# Patient Record
Sex: Male | Born: 1945 | Race: White | Hispanic: No | Marital: Married | Smoking: Never smoker
Health system: Southern US, Community
[De-identification: ages and names within clinical notes are randomized; demographics above are authoritative.]

## PROBLEM LIST (undated history)

## (undated) DIAGNOSIS — E785 Hyperlipidemia, unspecified: Secondary | ICD-10-CM

## (undated) DIAGNOSIS — R05 Cough: Secondary | ICD-10-CM

## (undated) DIAGNOSIS — M419 Scoliosis, unspecified: Secondary | ICD-10-CM

## (undated) DIAGNOSIS — I10 Essential (primary) hypertension: Secondary | ICD-10-CM

## (undated) DIAGNOSIS — Z860100 Personal history of colon polyps, unspecified: Secondary | ICD-10-CM

## (undated) DIAGNOSIS — R059 Cough, unspecified: Secondary | ICD-10-CM

## (undated) DIAGNOSIS — H269 Unspecified cataract: Secondary | ICD-10-CM

## (undated) HISTORY — DX: Personal history of colon polyps, unspecified: Z86.0100

## (undated) HISTORY — DX: Scoliosis, unspecified: M41.9

## (undated) HISTORY — PX: OTHER SURGICAL HISTORY: SHX169

## (undated) HISTORY — PX: HERNIA REPAIR: SHX51

## (undated) HISTORY — PX: TONSILLECTOMY: SUR1361

## (undated) HISTORY — PX: HIP SURGERY: SHX245

## (undated) HISTORY — DX: Cough, unspecified: R05.9

## (undated) HISTORY — DX: Essential (primary) hypertension: I10

## (undated) HISTORY — DX: Hyperlipidemia, unspecified: E78.5

## (undated) HISTORY — DX: Personal history of colonic polyps: Z86.010

## (undated) HISTORY — DX: Unspecified cataract: H26.9

---
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
## 2005-04-17 ENCOUNTER — Ambulatory Visit: Payer: Self-pay | Admitting: Internal Medicine

## 2005-05-15 ENCOUNTER — Ambulatory Visit: Payer: Self-pay | Admitting: Internal Medicine

## 2005-05-15 ENCOUNTER — Ambulatory Visit: Payer: Self-pay

## 2006-02-23 ENCOUNTER — Ambulatory Visit: Payer: Self-pay | Admitting: Internal Medicine

## 2006-03-19 ENCOUNTER — Ambulatory Visit: Payer: Self-pay | Admitting: Internal Medicine

## 2006-05-06 ENCOUNTER — Ambulatory Visit: Payer: Self-pay | Admitting: Family Medicine

## 2006-05-27 ENCOUNTER — Ambulatory Visit: Payer: Self-pay | Admitting: Family Medicine

## 2006-11-09 ENCOUNTER — Ambulatory Visit: Payer: Self-pay | Admitting: Family Medicine

## 2007-03-22 ENCOUNTER — Ambulatory Visit (HOSPITAL_COMMUNITY): Admission: RE | Admit: 2007-03-22 | Discharge: 2007-03-22 | Payer: Self-pay | Admitting: General Surgery

## 2007-06-08 ENCOUNTER — Ambulatory Visit: Payer: Self-pay | Admitting: Family Medicine

## 2007-06-22 ENCOUNTER — Ambulatory Visit: Payer: Self-pay | Admitting: Family Medicine

## 2007-07-21 ENCOUNTER — Ambulatory Visit: Payer: Self-pay | Admitting: Family Medicine

## 2007-08-20 ENCOUNTER — Ambulatory Visit: Payer: Self-pay | Admitting: Family Medicine

## 2007-08-27 ENCOUNTER — Ambulatory Visit: Payer: Self-pay | Admitting: Internal Medicine

## 2007-08-27 LAB — CONVERTED CEMR LAB
BUN: 14 mg/dL (ref 6–23)
Calcium: 9.4 mg/dL (ref 8.4–10.5)
GFR calc Af Amer: 177 mL/min
GFR calc non Af Amer: 146 mL/min

## 2008-01-25 ENCOUNTER — Ambulatory Visit: Payer: Self-pay | Admitting: Internal Medicine

## 2008-01-31 ENCOUNTER — Ambulatory Visit: Payer: Self-pay | Admitting: Internal Medicine

## 2008-03-23 ENCOUNTER — Ambulatory Visit: Payer: Self-pay | Admitting: Internal Medicine

## 2008-03-23 LAB — CONVERTED CEMR LAB
ALT: 31 units/L (ref 0–53)
Total CK: 134 units/L (ref 7–195)

## 2008-04-03 ENCOUNTER — Ambulatory Visit: Payer: Self-pay | Admitting: Family Medicine

## 2008-07-14 HISTORY — PX: COLONOSCOPY: SHX174

## 2008-09-07 ENCOUNTER — Ambulatory Visit: Payer: Self-pay | Admitting: Family Medicine

## 2008-10-18 ENCOUNTER — Ambulatory Visit: Payer: Self-pay | Admitting: Gastroenterology

## 2008-11-02 ENCOUNTER — Telehealth (INDEPENDENT_AMBULATORY_CARE_PROVIDER_SITE_OTHER): Payer: Self-pay | Admitting: *Deleted

## 2008-12-12 ENCOUNTER — Ambulatory Visit: Payer: Self-pay | Admitting: Internal Medicine

## 2008-12-13 ENCOUNTER — Encounter: Payer: Self-pay | Admitting: Internal Medicine

## 2009-02-10 DIAGNOSIS — I10 Essential (primary) hypertension: Secondary | ICD-10-CM | POA: Insufficient documentation

## 2009-02-10 DIAGNOSIS — E785 Hyperlipidemia, unspecified: Secondary | ICD-10-CM

## 2009-02-20 ENCOUNTER — Encounter: Payer: Self-pay | Admitting: Internal Medicine

## 2009-02-22 ENCOUNTER — Ambulatory Visit: Payer: Self-pay | Admitting: Internal Medicine

## 2009-03-05 ENCOUNTER — Ambulatory Visit: Payer: Self-pay

## 2009-03-05 ENCOUNTER — Encounter: Payer: Self-pay | Admitting: Internal Medicine

## 2009-03-22 ENCOUNTER — Ambulatory Visit: Payer: Self-pay | Admitting: Family Medicine

## 2009-04-19 ENCOUNTER — Ambulatory Visit: Payer: Self-pay | Admitting: Family Medicine

## 2009-06-05 ENCOUNTER — Ambulatory Visit: Payer: Self-pay | Admitting: Family Medicine

## 2009-06-12 ENCOUNTER — Ambulatory Visit: Payer: Self-pay | Admitting: Internal Medicine

## 2009-06-18 ENCOUNTER — Telehealth: Payer: Self-pay | Admitting: Internal Medicine

## 2009-11-06 ENCOUNTER — Encounter: Payer: Self-pay | Admitting: Internal Medicine

## 2009-11-06 ENCOUNTER — Ambulatory Visit: Payer: Self-pay | Admitting: Family Medicine

## 2009-12-19 LAB — HM COLONOSCOPY

## 2010-02-11 ENCOUNTER — Ambulatory Visit: Payer: Self-pay | Admitting: Internal Medicine

## 2010-04-05 ENCOUNTER — Ambulatory Visit: Payer: Self-pay | Admitting: Family Medicine

## 2010-08-13 NOTE — Assessment & Plan Note (Signed)
Summary: 1 YR/DMP  Medications Added * FIBER POWDER metamucil/citrucel as directed RED YEAST RICE   POWD (RED YEAST RICE EXTRACT) 600mg  2 tabs once daily CO Q-10 30 MG  CAPS (COENZYME Q10) 2 tabs qd      Allergies Added: NKDA  Visit Type:  Follow-up Primary Provider:  Sharlot Gowda  CC:  none.  History of Present Illness: Patient is a 65 year old with a history of dyslpidemia and HTN.  Normal carotdi arteries.   Last in clinic in November. Since seen he is doing well.  No chest pain.  Breathing is OK.    Current Medications (verified): 1)  Vitamin C 500 Mg  Tabs (Ascorbic Acid) .... 2 Tabs Daily 2)  Fish Oil   Oil (Fish Oil) .... 2 Tabs Daily 3)  Fiber Powder .... Metamucil/citrucel As Directed 4)  Aspirin 81 Mg  Tabs (Aspirin) .Marland Kitchen.. 1 Tab Every Other Day 5)  Lisinopril-Hydrochlorothiazide 10-12.5 Mg Tabs (Lisinopril-Hydrochlorothiazide) .Marland Kitchen.. 1 Tab  Once Daily 6)  Multivitamins   Tabs (Multiple Vitamin) .Marland Kitchen.. 1 Tab By Mouth Once Daily 7)  Probiotic  Caps (Probiotic Product) .Marland Kitchen.. 1 Tab By Mouth Once Daily 8)  Red Yeast Rice   Powd (Red Yeast Rice Extract) .... 600mg  2 Tabs Once Daily 9)  Co Q-10 30 Mg  Caps (Coenzyme Q10) .... 2 Tabs Qd  Allergies (verified): No Known Drug Allergies  Past History:  Past medical, surgical, family and social histories (including risk factors) reviewed, and no changes noted (except as noted below).  Past Medical History: Reviewed history from 02/10/2009 and no changes required. Current Problems:  DYSLIPIDEMIA (ICD-272.4) HYPERTENSION (ICD-401.9) SCOLIOSIS  Past Surgical History: Reviewed history from 02/10/2009 and no changes required. Tonsillectomy Scoliosis x 2 Hip surgery Right Inguinal hernia  Family History: Reviewed history from 02/10/2009 and no changes required. Mother died at age 83with, by report, heart failure diagnosed at death. Father died at age 101 of cancer. One sister died at age 25 of cancer. One sister died at age 67  murdered. One brother died at age 72 of cancer. One brother alive with coronary artery disease.  Social History: Reviewed history from 02/10/2009 and no changes required. The patient does not smoke and does not drink.  Vital Signs:  Patient profile:   65 year old male Height:      71 inches Weight:      179 pounds BMI:     25.06 BP sitting:   131 / 77  (left arm) Cuff size:   regular  Vitals Entered By: Burnett Kanaris, CNA (February 11, 2010 2:34 PM)  Physical Exam  Additional Exam:  Patient is in NAD HEENT:  Normocephalic, atraumatic. EOMI, PERRLA.  Neck: JVP is normal. No thyromegaly. No bruits.  Lungs: clear to auscultation. No rales no wheezes.  Heart: Regular rate and rhythm. Normal S1, S2. No S3.   No significant murmurs. PMI not displaced.  Abdomen:  Supple, nontender. Normal bowel sounds. No masses. No hepatomegaly.  Extremities:   Good distal pulses throughout. No lower extremity edema.  Musculoskeletal :moving all extremities. Scoliosis Neuro:   alert and oriented x3.    EKG  Procedure date:  02/11/2010  Findings:      Sinus rhythm.  71  bpm.  RBBB.  Impression & Recommendations:  Problem # 1:  HYPERTENSION (ICD-401.9) Adequate control  Keep on same regimen. His updated medication list for this problem includes:    Aspirin 81 Mg Tabs (Aspirin) .Marland Kitchen... 1 tab every other  day    Lisinopril-hydrochlorothiazide 10-12.5 Mg Tabs (Lisinopril-hydrochlorothiazide) .Marland Kitchen... 1 tab  once daily  Problem # 2:  DYSLIPIDEMIA (ICD-272.4) His last lipid panel in November of last year showed an improvment in his LDL from the 140s to 121.  Keep working on diet for now with fish oil and red yeast rice.  Check lipids in spring.

## 2010-11-07 ENCOUNTER — Encounter (INDEPENDENT_AMBULATORY_CARE_PROVIDER_SITE_OTHER): Payer: BC Managed Care – PPO | Admitting: Family Medicine

## 2010-11-07 DIAGNOSIS — I10 Essential (primary) hypertension: Secondary | ICD-10-CM

## 2010-11-07 DIAGNOSIS — Z79899 Other long term (current) drug therapy: Secondary | ICD-10-CM

## 2010-11-07 DIAGNOSIS — E7800 Pure hypercholesterolemia, unspecified: Secondary | ICD-10-CM

## 2010-11-07 DIAGNOSIS — Z0000 Encounter for general adult medical examination without abnormal findings: Secondary | ICD-10-CM

## 2010-11-07 DIAGNOSIS — N400 Enlarged prostate without lower urinary tract symptoms: Secondary | ICD-10-CM

## 2010-11-08 ENCOUNTER — Encounter: Payer: Self-pay | Admitting: Internal Medicine

## 2010-11-12 ENCOUNTER — Encounter: Payer: Self-pay | Admitting: Internal Medicine

## 2010-11-12 ENCOUNTER — Other Ambulatory Visit: Payer: Self-pay | Admitting: *Deleted

## 2010-11-15 ENCOUNTER — Encounter: Payer: Self-pay | Admitting: Internal Medicine

## 2010-11-15 ENCOUNTER — Ambulatory Visit (INDEPENDENT_AMBULATORY_CARE_PROVIDER_SITE_OTHER): Payer: BC Managed Care – PPO | Admitting: Internal Medicine

## 2010-11-15 DIAGNOSIS — E785 Hyperlipidemia, unspecified: Secondary | ICD-10-CM

## 2010-11-15 DIAGNOSIS — I10 Essential (primary) hypertension: Secondary | ICD-10-CM

## 2010-11-15 NOTE — Progress Notes (Signed)
HPI Patient is a 65 year old with a history of hypertension and dyslipidemia I saw him in clinic last Spring.  Since seen he has done OK  He denies chest pains.  BReathing is OK.  He is fairly active  (walking)  No Known Allergies  Current Outpatient Prescriptions  Medication Sig Dispense Refill  . Ascorbic Acid (VITAMIN C) 500 MG tablet Take 500 mg by mouth daily. 2 tabs daily       . aspirin 81 MG tablet Take 81 mg by mouth daily.        Marland Kitchen co-enzyme Q-10 30 MG capsule Take 2 capsules by mouth daily.        Marland Kitchen lisinopril-hydrochlorothiazide (PRINZIDE,ZESTORETIC) 20-12.5 MG per tablet Take 1 tablet by mouth daily.        . Multiple Vitamin (MULTIVITAMIN) capsule Take 1 capsule by mouth daily.        . Omega-3 Fatty Acids (FISH OIL PO) Take 2 tablets by mouth daily.        Marland Kitchen PROBIOTIC CAPS Take by mouth daily.        . Psyllium (METAMUCIL PO) Take by mouth. As directed       . Red Yeast Rice POWD by Does not apply route. 600 mg 2 tabs once daily       . DISCONTD: lisinopril-hydrochlorothiazide (PRINZIDE,ZESTORETIC) 10-12.5 MG per tablet Take 1 tablet by mouth daily.          Past Medical History  Diagnosis Date  . Dyslipidemia   . HTN (hypertension)   . Scoliosis     Past Surgical History  Procedure Date  . Tonsillectomy   . Scoliosis     x2  . Hip surgery   . Right inguinal hernia     Family History  Problem Relation Age of Onset  . Heart failure Mother   . Cancer Father   . Cancer Sister   . Cancer Brother   . Coronary artery disease Brother     History   Social History  . Marital Status: Married    Spouse Name: N/A    Number of Children: N/A  . Years of Education: N/A   Occupational History  . Not on file.   Social History Main Topics  . Smoking status: Never Smoker   . Smokeless tobacco: Not on file  . Alcohol Use: No  . Drug Use: Not on file  . Sexually Active: Not on file   Other Topics Concern  . Not on file   Social History Narrative  . No  narrative on file    Review of Systems:  All systems reviewed.  They are negative to the above problem except as previously stated.  Vital Signs: BP 135/85  Pulse 78  Resp 18  Ht 5\' 10"  (1.778 m)  Wt 180 lb 12.8 oz (82.01 kg)  BMI 25.94 kg/m2  Physical Exam  HEENT:  Normocephalic, atraumatic. EOMI, PERRLA.  Neck: JVP is normal. No thyromegaly. No bruits.  Lungs: clear to auscultation. No rales no wheezes.  Heart: Regular rate and rhythm. Normal S1, S2. No S3.   No significant murmurs. PMI not displaced.  Abdomen:  Supple, nontender. Normal bowel sounds. No masses. No hepatomegaly.  Extremities:   Good distal pulses throughout. No lower extremity edema.  Musculoskeletal :moving all extremities.  Neuro:   alert and oriented x3.  CN II-XII grossly intact.  EKG:  NSR.  72 bpm.  RBBB.  Assessment and Plan:

## 2010-11-15 NOTE — Assessment & Plan Note (Signed)
Adequate control.  BMET is OK.

## 2010-11-15 NOTE — Assessment & Plan Note (Signed)
Patient had labs drawn last wk.  I do not see a lipid   WIll call back to see if drawn.  If not would repeat. Normal carotid USN before.

## 2010-11-24 ENCOUNTER — Other Ambulatory Visit: Payer: Self-pay | Admitting: Gastroenterology

## 2010-11-26 NOTE — Assessment & Plan Note (Signed)
[facility]                            CARDIOLOGY OFFICE NOTE   TYLOR, COURTWRIGHT                       MRN:          [id number]  DATE:01/31/2008                            DOB:          May 04, 1946    IDENTIFICATION:  Mr. Denardo is a 65 year old gentleman.  I saw him last  in May.  He has a history of hypertension and dyslipidemia.  When I saw  him last, I went ahead and checked a fasting lipomet panel, and he comes  in for return.  Note, also his blood pressure was a little up when I saw  him, plan for return.   Since seeing the patient, he states he is doing well.  He denies chest  pressure.  Breathing is okay.  He is watching his diet.   CURRENT MEDICATIONS:  Vitamin C, fish oil, lisinopril, HCTZ 10/12.5,  Citrucel, aspirin 81, and jamba juice.   PHYSICAL EXAMINATION:  GENERAL:  The patient is in no distress at rest.  VITAL SIGNS:  Blood pressure 132/84, pulse is 60, weight 170, down 4  pounds from previous.  NECK:  No JVD.  No bruits.  LUNGS:  Clear.  CARDIAC:  Regular rate and rhythm, S1, S2.  No S3.  No murmurs.  ABDOMEN:  Benign.  EXTREMITIES:  No edema.   LABORATORY DATA:  Lipomet panel, total cholesterol is reported at 193  with an HDL of 40, which is different from previous when he was in the  50s.  LDL is still up at 141 with a particle number of 1540.   IMPRESSION:  1. Hypertension.  Fair control today.  Would continue.  2. Dyslipidemia.  I would start treating him.  He did not tolerate      Lipitor.  We discussed the primary prevention trial.  We can try      Zocor 40.  Followup lipomet in 8 weeks.  AST and CK as well since      he had achiness.   PLAN:  I will set to see the patient back in the winter.  Continue on  his medicines.  The jamba juice he is drinking I am not convinced is  helping.  He can take a multivitamin instead of that and a vitamin C.   ADDENDUM   A 12-lead EKG, normal sinus rhythm, right bundle-branch  block, LVH.  Q  wave in L.  No significant change from February.     Pricilla Riffle, MD, [facility]  Electronically Signed    PVR/MedQ  DD: 01/31/2008  DT: 02/01/2008  Job #: [id number]   cc:   Sharlot Gowda, M.D.

## 2010-11-26 NOTE — Assessment & Plan Note (Signed)
[facility]                            CARDIOLOGY OFFICE NOTE   [name], [name]                       MRN:          [id number]  DATE:08/27/2007                            DOB:          February 12, 1946    IDENTIFICATION:  [name] is a 65 year old gentleman with a history of  hypertension and dyslipidemia.  I last saw him back in September 2007.   In the interval he has done okay.  He had his blood work checked in  primary care.  A total cholesterol was 224, HDL of 59 which is increased  significantly from previous, lDL of 143.   On talking to the patient he says he does not need too much fatty foods.   He is walking on the treadmill 35-60 minutes per day.  Denies chest  pain.  No change in his breathing (note he has significant scoliosis  limiting some breathing).   CURRENT MEDICATIONS:  Vitamin C 2 grams daily, aspirin 81 every other  day, fish oil 1200 mg 2 daily, and lisinopril hydrochlorothiazide  recently started 10/12.5 daily.   PHYSICAL EXAM:  The patient is in no distress.  Blood pressure 145/90.  On my check 140/88 pulse is 57 and regular,  weight 174.  Lungs are clear.  Cardiac exam regular rate and rhythm, S1-S2 no S3 no murmurs.  ABDOMEN:  Benign.  EXTREMITIES:  No edema.   IMPRESSION:  1. Dyslipidemia.  I think he can especially with the [place] data,      improve his control.  We will go ahead and check a fasting lipomet      in August.  He had been reluctant to take medicines but will if      needed.  2. Hypertension.  He is taking his medicines more in the afternoon I      told him to take it in the morning and will need follow-up.  I      would not change for now will check a BMET.   Otherwise I will set to see him back after his lipomet.     Pricilla Riffle, MD, [facility]  Electronically Signed    PVR/MedQ  DD: 08/27/2007  DT: 08/30/2007  Job #: [id number]   cc:   Sharlot Gowda, M.D.

## 2010-11-26 NOTE — Op Note (Signed)
[name], [name]                ACCOUNT NO.:  [id number]   MEDICAL RECORD NO.:  [id number]          PATIENT TYPE:  AMB   LOCATION:  SDS                          FACILITY:  [facility]   PHYSICIAN:  Ollen Gross. Vernell Morgans, M.D. DATE OF BIRTH:  1946-05-14   DATE OF PROCEDURE:  03/22/2007  DATE OF DISCHARGE:                               OPERATIVE REPORT   PREOPERATIVE DIAGNOSIS:  Right inguinal hernia.   POSTOPERATIVE DIAGNOSIS:  Right inguinal hernia, direct.   PROCEDURES:  Right inguinal repair with mesh.   SURGEON:  Ollen Gross. Vernell Morgans, M.D.   ANESTHESIA:  General endotracheal.   PROCEDURE:  After informed consent was obtained, the patient was brought  to the operating room and placed in the supine position on the operating  room table.  After adequate induction of general anesthesia, the  patient's right groin area was prepped with Betadine and draped in usual  sterile manner.  The right groin area was then infiltrated 0.25%  Marcaine.  A small incision was made from the edge of the pubic tubercle  towards the anterior-superior iliac spine.  This incision was carried  down through the skin and subcutaneous tissue sharply with the  electrocautery until the fascia of the external oblique was encountered.  The fascia of the external oblique was opened along its fibers towards  the apex of the external ring with Metzenbaum scissors.  A Weitlaner  retractor was deployed.  Blunt dissection was then carried out of the  cord structures until the cord structures could be surrounded between  two fingers at the edge of the pubic tubercle.  A 1/2 inch Penrose drain  was then placed around the cord structures for retraction purposes.  The  cord structures were gently skeletonized by a combination of blunt  hemostat dissection and some sharp dissection with the electrocautery.  No hernia was identified with the cord.  A moderate sized lipoma of the  cord was excised sharply with the electrocautery.   The patient did have  an obvious defect on the floor of the inguinal canal with a moderate-  sized direct hernia.  This hernia was broad-based.  It was therefore  reduced and the floor of the canal was repaired with a several running 0  Vicryl stitch.  Tails of the Vicryl were left long near the edge of the  cord.  The ilioinguinal nerve was also identified during the dissection  and clamped proximally and distally, divided and ligated with 3-0 silk  ties.  Next, a 3 x 6 piece of ULTRAPRO mesh was chosen and cut to fit.  The mesh was sewed inferiorly to the shelving edge of the inguinal  ligament with a running 2-0 Prolene stitch.  Tails were cut in the mesh  laterally and the tails were wrapped around the cord structures  superiorly.  The mesh was sewed to the muscular aponeurotic strength  layer of the transversalis with interrupted 2-0 Prolene vertical  mattress stitches.  The tails of the mesh were anchored lateral to the  cord to the shelving edge of the inguinal ligament with  interrupted 2-0  Prolene stitch.  Once this was accomplished, the mesh was in good  position.  The tails of the 0 Vicryl were also brought through the mesh  and tied.  The mesh was in good position without any tension and the  hernia appeared to be well repaired.  The wound was irrigated with  copious amounts of saline.  The fascia of the external oblique was then  reapproximated with a running 2-0 Vicryl stitch.  The wound was then  infiltrated more 0.25% Marcaine with epinephrine.  The subcutaneous  fascia was closed with running 3-0 Vicryl stitch and skin was closed  with running 4-0 Monocryl subcuticular stitch.  A Dermabond dressing was  applied.  The patient tolerated the procedure well.  At the end of the  case all needle, sponge and instrument counts were correct.  The patient  was then awake and taken to recovery in stable condition.      Ollen Gross. Vernell Morgans, M.D.  Electronically Signed      PST/MEDQ  D:  03/22/2007  T:  03/22/2007  Job:  [id number]

## 2010-11-29 NOTE — Assessment & Plan Note (Signed)
[facility]                              CARDIOLOGY OFFICE NOTE   NIKAN, ELLINGSON                       MRN:          [id number]  DATE:02/23/2006                            DOB:          07/24/1945    IDENTIFICATION:  [name] is a 65 year old gentleman who I saw back in  October of last year for dyslipidemia.   In the interval, he has done fairly well.  He called in and said the Lipitor  was causing him to ache and he went ahead and we recommended stopping it for  a couple of weeks and contacting us.  He stopped it but did not call back  and he has not restarted it.  His aching went away.  He is now on red yeast  rice 600 mg two tablets twice a day.   He is eating some fatty foods; also cut back on his salt.   Otherwise, remains active, walking five times per week.  Notes no change in  his ability to do this.  No change in his breathing.  No chest pain.   CURRENT MEDICATIONS:  1. Fish oil 1.2 g daily.  2. Vitamin C daily.  3. Cholestoff two b.i.d.  4. Red yeast rice 1200 mg b.i.d.  5. Aspirin 81 mg daily.  6. Citrucel daily.   PHYSICAL EXAMINATION:  GENERAL:  The patient is in no distress.  VITAL SIGNS:  Blood pressure 154/102 on my check, 150/96.  Pulse 63.  Weight  172.  LUNGS:  Clear.  CARDIAC:  Regular rate and rhythm.  S1 and S2.  No S3.  No murmurs.  ABDOMEN:  Benign.  EXTREMITIES:  No edema.   LABORATORY DATA:  A 12-lead EKG:  Normal sinus rhythm, 63 beats per minute.  Right bundle branch block.  LVH.   IMPRESSION:  1. Dyslipidemia.  Will check a fasting lipid panel and LFTs given that he      is on the red yeast rice.  2. Hypertension, much higher today that it was previously.  Will have him      check his blood      pressures at home, bring in the cuff.  I will see him in a few weeks.      Will check a BMET and CBC today.                                Pricilla Riffle, MD, [facility]    PVR/MedQ  DD:  02/23/2006  DT:   02/23/2006  Job #:  [id number]   cc:   Sharlot Gowda, MD

## 2010-11-29 NOTE — Assessment & Plan Note (Signed)
[facility]                              CARDIOLOGY OFFICE NOTE   ARIZONA, NORDQUIST                       MRN:          [id number]  DATE:03/19/2006                            DOB:          07-Nov-1945    IDENTIFICATION:  Mr. Killgore is a 65 year old gentleman whom I last saw back  in August.  When I saw him his blood pressure was actually quite high,  150/102, and I recommended we check it back at home and we would follow up.   In the interval he has also had some blood work done, glucose 106, otherwise  hemoglobin was normal.  Cholesterol panel, [facility], his LDL concentration  was 153 with a particle number of 1764, HDL of 42, triglycerides 84, total  cholesterol 212.  He is on red yeast rice.   Since seen he has been doing okay.  He says he has checked his blood  pressure and it is better.  He brings in a log today.  It is actually  ranging in the 110s to 139 systolic.   CURRENT MEDICATIONS:  1. Fish oil 1.2 g per day.  2. Vitamin C 1 g daily.  3. __________ two b.i.d.  4. Red yeast rice 1200 mg b.i.d.  5. Aspirin 81 mg daily.  6. Citrucel daily.   PHYSICAL EXAMINATION:  GENERAL:  The patient is in no distress.  VITAL SIGNS:  Blood pressure 122/84, pulse is 72, weight 170.  LUNGS:  Clear.  CARDIAC:  Regular rate and rhythm, S1, S2, no S3, no S4.  No murmurs.  ABDOMEN:  Benign.  EXTREMITIES:  No edema.   IMPRESSION:  1. Dyslipidemia, on red yeast rice.  Still not much different from when he      first started.  In talking to him about his diet, he has some room to      make changes and will go ahead and review with dietary, refer to      dietary.  He by Framingham Risk Status is at a 10% for a 10-year period      risk for a cardiac event.  I think unless he makes a marked change in      his lipids, I would begin statin therapy.  He should a marked      improvement on Lipitor.  He is anxious, though, to be on medicines.  I      would  stop the red yeast rice.  Does not look like it is doing much.      Follow up in about 9 months' time.  2. Hypertension.  Adequate control.   I will set to see the patient back next summer, sooner if problems develop.                                Pricilla Riffle, MD, [facility]    PVR/MedQ  DD:  03/19/2006  DT:  03/20/2006  Job #:  [id number]   cc:   Sharlot Gowda, M.D.

## 2010-12-02 ENCOUNTER — Telehealth: Payer: Self-pay | Admitting: Family Medicine

## 2010-12-02 NOTE — Telephone Encounter (Signed)
Pt came in today to have his bp machine evaluated.  It appears to be same as ours.  Also pt wanted to know when he needed to return for ov.  Pulled chart pt needs 1 mo follow up end of May 2012.

## 2010-12-12 ENCOUNTER — Encounter: Payer: Self-pay | Admitting: Family Medicine

## 2010-12-12 ENCOUNTER — Ambulatory Visit: Payer: BC Managed Care – PPO | Admitting: Family Medicine

## 2010-12-13 ENCOUNTER — Encounter: Payer: Self-pay | Admitting: Family Medicine

## 2010-12-13 ENCOUNTER — Ambulatory Visit (INDEPENDENT_AMBULATORY_CARE_PROVIDER_SITE_OTHER): Payer: BC Managed Care – PPO | Admitting: Family Medicine

## 2010-12-13 VITALS — BP 136/80 | HR 68 | Wt 181.0 lb

## 2010-12-13 DIAGNOSIS — I10 Essential (primary) hypertension: Secondary | ICD-10-CM

## 2010-12-13 NOTE — Progress Notes (Signed)
  Subjective:    Patient ID: [name], male    DOB: 06-25-1946, 65 y.o.   MRN: [id number]  HPI he is here for recheck on his blood pressure. He is now on a higher dose of lisinopril. He has been checking his blood pressures at home and didn't bring them in. His machine at home is accurate.    Review of Systems     Objective:   Physical Exam Alert and in no distress. Blood pressure is recorded.       Assessment & Plan:  Hypertension now under better control. Continue present medication and recheck here in 6 months.

## 2010-12-13 NOTE — Patient Instructions (Signed)
Stay on your medications and we'll check again in 6 months

## 2011-01-03 ENCOUNTER — Telehealth: Payer: Self-pay | Admitting: *Deleted

## 2011-01-03 NOTE — Telephone Encounter (Signed)
Called patient with results of lab work from 4/26 at [facility] office. LDL was 125. Still taking red yeast rice 2 times per day. Will watch diet and walk more. Dr.Ross aware.

## 2011-03-28 ENCOUNTER — Ambulatory Visit (INDEPENDENT_AMBULATORY_CARE_PROVIDER_SITE_OTHER): Payer: BC Managed Care – PPO | Admitting: Medical

## 2011-03-28 ENCOUNTER — Encounter: Payer: Self-pay | Admitting: Medical

## 2011-03-28 VITALS — BP 132/90 | HR 72 | Temp 97.8°F | Resp 20 | Ht 71.0 in | Wt 179.0 lb

## 2011-03-28 DIAGNOSIS — J069 Acute upper respiratory infection, unspecified: Secondary | ICD-10-CM

## 2011-03-28 DIAGNOSIS — R05 Cough: Secondary | ICD-10-CM

## 2011-03-28 MED ORDER — CLARITHROMYCIN 500 MG PO TABS: 500.0000 mg | ORAL_TABLET | Freq: Two times a day (BID) | ORAL | Status: DC

## 2011-03-28 MED ORDER — CLARITHROMYCIN 500 MG PO TABS: 500.0000 mg | ORAL_TABLET | Freq: Two times a day (BID) | ORAL | Status: AC

## 2011-03-28 MED ORDER — BENZONATATE 100 MG PO CAPS: 100.0000 mg | ORAL_CAPSULE | Freq: Four times a day (QID) | ORAL | Status: DC | PRN

## 2011-03-28 NOTE — Progress Notes (Signed)
Subjective:     [name] is a 65 y.o. male who presents for evaluation of "bad cold."  5 days ago started getting itchy throat, began using salt water gargles.  But over the next few days, got worse runny nose, drainage, cough, chest congestion, and is now sore in his chest from coughing so much.  Has had some mild productive sputum and nasal discharge, yellow green but also clear at times.   He notes subjective fever.  Used some Sudafed yesterday.  No sick contacts.  Last illness was probably a year ago.  He is a nonsmoker without hx/o frequent respiratory infections.  No other aggravating or relieving factors.  No other c/o.  The following portions of the patient's history were reviewed and updated as appropriate: allergies, current medications, past family history, past medical history, past social history, past surgical history and problem list.  Review of Systems Constitutional: +low grade fever; denies chills, sweats, anorexia Skin: denies rash HEENT: +sore throat; denies ear pain, itchy watery eyes Cardiovascular: denies chest pain Lungs: denies wheezing, SOB Abdomen: denies abdominal pain, nausea, vomiting, diarrhea GU: denies dysuria  Objective:   Filed Vitals:   03/28/11 1431  BP: 132/90  Pulse: 72  Temp: 97.8 F (36.6 C)  Resp: 20    General appearance: Alert, WD/WN, no distress, mildly ill appearing                             Skin: warm, no rash                           Head: no sinus tenderness                            Eyes: conjunctiva normal, corneas clear, PERRLA                            Ears: pearly TMs, external ear canals normal                          Nose: septum midline, turbinates swollen, with erythema and clear discharge             Mouth/throat: MMM, tongue normal, mild pharyngeal erythema                           Neck: supple, no adenopathy, no thyromegaly, nontender                          Heart: RRR, normal S1, S2, no murmurs               Lungs: CTA bilaterally, no wheezes, rales, or rhonchi                           Back: quite remarkable scoliosis, long vertical surgical scar down back, left scapula winged up     Assessment:   Encounter Diagnoses  Name Primary?  . URI (upper respiratory infection) Yes  . Cough     Plan:   Suggested symptomatic OTC remedies, avoid Sudafed, but instead try Coricidin HBP or Mucinex DM, rest, hydrate well.  Can c/t salt water gargles, warm fluids, Tylenol or Ibuprofen OTC for  fever and malaise.  Call/return in 2-3 days if symptoms aren't resolving.   If much worse over the weekend such as worsening chest congestion, worsening colored sputum, fever over 101, then begin antibiotic, Biaxin.  Otherwise call early next week if not improving.

## 2011-03-28 NOTE — Patient Instructions (Addendum)
Rest, hydrate well, continue salt water gargles.   Instead of sudafed, use either Mucinex DM or Coricidin HBP for cough and congestion.  I also wrote a script for [employer] which is a cough suppressant.  You can use this alternating with Mucinex DM or Coricidin HBP for bad cough/cough suppression.  If you worsen over the weekend, begin Biaxin (antibiotic).

## 2011-04-25 LAB — BASIC METABOLIC PANEL
CO2: 31
Calcium: 9.9
Creatinine, Ser: 0.67
Glucose, Bld: 100 — ABNORMAL HIGH

## 2011-04-25 LAB — DIFFERENTIAL
Basophils Absolute: 0
Basophils Relative: 1
Eosinophils Absolute: 0.1
Monocytes Absolute: 0.4
Neutro Abs: 3.2
Neutrophils Relative %: 61

## 2011-04-25 LAB — CBC
Hemoglobin: 14.9
MCHC: 34
RDW: 12.1

## 2011-05-20 ENCOUNTER — Other Ambulatory Visit (INDEPENDENT_AMBULATORY_CARE_PROVIDER_SITE_OTHER): Payer: BC Managed Care – PPO

## 2011-05-20 DIAGNOSIS — Z23 Encounter for immunization: Secondary | ICD-10-CM

## 2011-06-16 ENCOUNTER — Ambulatory Visit: Payer: BC Managed Care – PPO | Admitting: Family Medicine

## 2011-11-10 ENCOUNTER — Ambulatory Visit (INDEPENDENT_AMBULATORY_CARE_PROVIDER_SITE_OTHER): Payer: Medicare Other | Admitting: Family Medicine

## 2011-11-10 ENCOUNTER — Encounter: Payer: Self-pay | Admitting: Family Medicine

## 2011-11-10 VITALS — BP 128/86 | HR 62 | Ht 70.0 in | Wt 178.0 lb

## 2011-11-10 DIAGNOSIS — E785 Hyperlipidemia, unspecified: Secondary | ICD-10-CM

## 2011-11-10 DIAGNOSIS — N400 Enlarged prostate without lower urinary tract symptoms: Secondary | ICD-10-CM

## 2011-11-10 DIAGNOSIS — N402 Nodular prostate without lower urinary tract symptoms: Secondary | ICD-10-CM

## 2011-11-10 DIAGNOSIS — I10 Essential (primary) hypertension: Secondary | ICD-10-CM

## 2011-11-10 DIAGNOSIS — Z79899 Other long term (current) drug therapy: Secondary | ICD-10-CM

## 2011-11-10 LAB — CBC WITH DIFFERENTIAL/PLATELET
Basophils Absolute: 0 10*3/uL (ref 0.0–0.1)
Basophils Relative: 1 % (ref 0–1)
Hemoglobin: 15.1 g/dL (ref 13.0–17.0)
MCHC: 32.3 g/dL (ref 30.0–36.0)
Monocytes Relative: 11 % (ref 3–12)
Neutro Abs: 2.9 10*3/uL (ref 1.7–7.7)
Neutrophils Relative %: 53 % (ref 43–77)
Platelets: 290 10*3/uL (ref 150–400)

## 2011-11-10 LAB — COMPREHENSIVE METABOLIC PANEL
ALT: 25 U/L (ref 0–53)
AST: 20 U/L (ref 0–37)
Albumin: 4.6 g/dL (ref 3.5–5.2)
Alkaline Phosphatase: 45 U/L (ref 39–117)
Potassium: 4.2 mEq/L (ref 3.5–5.3)
Sodium: 139 mEq/L (ref 135–145)
Total Protein: 7.2 g/dL (ref 6.0–8.3)

## 2011-11-10 LAB — LIPID PANEL
LDL Cholesterol: 144 mg/dL — ABNORMAL HIGH (ref 0–99)
VLDL: 29 mg/dL (ref 0–40)

## 2011-11-10 NOTE — Progress Notes (Signed)
  Subjective:    Patient ID: [name], male    DOB: [birth year], 66 y.o.   MRN: [id number]  HPI He is here for his initial Medicare evaluation. He does have hypertension and presently is on Prinzide. He also is taking red yeast rice to help with his cholesterol. He has been seen in the past by urology and evaluated for a nodule. Otherwise he is not having any prostate related problems. He is now semiretired. He has no other concerns or complaints. His medical record was reviewed and he is up-to-date on his immunizations, colonoscopy and EKG.   Review of Systems  Constitutional: Negative.   HENT: Negative.   Eyes: Negative.   Respiratory: Negative.   Cardiovascular: Negative.   Gastrointestinal: Negative.   Genitourinary: Negative.   Musculoskeletal: Negative.   Skin: Negative.   Neurological: Negative.   Hematological: Negative.   Psychiatric/Behavioral: Negative.        Objective:   Physical Exam BP 128/86  Pulse 62  Ht 5\' 10"  (1.778 m)  Wt 178 lb (80.74 kg)  BMI 25.54 kg/m2  General Appearance:    Alert, cooperative, no distress, appears stated age  Head:    Normocephalic, without obvious abnormality, atraumatic  Eyes:    PERRL, conjunctiva/corneas clear, EOM's intact, fundi    benign  Ears:    Normal TM's and external ear canals  Nose:   Nares normal, mucosa normal, no drainage or sinus   tenderness  Throat:   Lips, mucosa, and tongue normal; teeth and gums normal  Neck:   Supple, no lymphadenopathy;  thyroid:  no   enlargement/tenderness/nodules; no carotid   bruit or JVD  Back:    Spine nontender, no curvature, ROM normal, no CVA     tenderness  Lungs:     Clear to auscultation bilaterally without wheezes, rales or     ronchi; respirations unlabored  Chest Wall:    No tenderness or deformity   Heart:    Regular rate and rhythm, S1 and S2 normal, no murmur, rub   or gallop  Breast Exam:    No chest wall tenderness, masses or gynecomastia  Abdomen:     Soft,  non-tender, nondistended, normoactive bowel sounds,    no masses, no hepatosplenomegaly  Genitalia:   deferred   Rectal:   deferred   Extremities:   No clubbing, cyanosis or edema  Pulses:   2+ and symmetric all extremities  Skin:   Skin color, texture, turgor normal, no rashes or lesions  Lymph nodes:   Cervical, supraclavicular, and axillary nodes normal  Neurologic:   CNII-XII intact, normal strength, sensation and gait; reflexes 2+ and symmetric throughout          Psych:   Normal mood, affect, hygiene and grooming.           Assessment & Plan:   1. Hypertension   2. Hyperlipidemia LDL goal < 100   3. BPH (benign prostatic hyperplasia)   4. Prostate nodule

## 2011-11-27 ENCOUNTER — Other Ambulatory Visit: Payer: Self-pay | Admitting: Family Medicine

## 2011-12-12 ENCOUNTER — Encounter: Payer: Self-pay | Admitting: Internal Medicine

## 2011-12-12 ENCOUNTER — Ambulatory Visit (INDEPENDENT_AMBULATORY_CARE_PROVIDER_SITE_OTHER): Payer: Medicare Other | Admitting: Internal Medicine

## 2011-12-12 VITALS — BP 130/80 | HR 57 | Ht 70.0 in | Wt 177.0 lb

## 2011-12-12 DIAGNOSIS — I2510 Atherosclerotic heart disease of native coronary artery without angina pectoris: Secondary | ICD-10-CM

## 2011-12-12 NOTE — Progress Notes (Signed)
HPI Pateint is a 66 year old with a history of HTN and HL.  ON red yeast rice  Had problems with Lipitor in past. Lipids in April showed LDL of 140s.  HDL of 43   I saw him in clinic 1 year ago.  Has worked on eating habits and notes he is feeling a little better. Denies CP.  Has some chronic SOB due to his signif scoliosis.  Has noted no progression  Note carotid dopplers in 2010 were normal with no intimal thickening. No Known Allergies  Current Outpatient Prescriptions  Medication Sig Dispense Refill  . acetaminophen (TYLENOL) 325 MG tablet Take 650 mg by mouth every 6 (six) hours as needed.        . Ascorbic Acid (VITAMIN C) 500 MG tablet Take 500 mg by mouth daily. 2 tabs daily       . aspirin 81 MG tablet Take 81 mg by mouth daily.        Marland Kitchen co-enzyme Q-10 30 MG capsule Take 2 capsules by mouth daily.        Marland Kitchen lisinopril-hydrochlorothiazide (PRINZIDE,ZESTORETIC) 20-12.5 MG per tablet take 1 tablet by mouth once daily  30 tablet  PRN  . Multiple Vitamin (MULTIVITAMIN) capsule Take 1 capsule by mouth daily.        . Omega-3 Fatty Acids (FISH OIL PO) Take 2 tablets by mouth daily.        Marland Kitchen PROBIOTIC CAPS Take by mouth daily.        . Psyllium (METAMUCIL PO) Take by mouth. As directed       . Red Yeast Rice POWD by Does not apply route. 600 mg 2 tabs once daily         Past Medical History  Diagnosis Date  . Dyslipidemia   . HTN (hypertension)   . Scoliosis     Past Surgical History  Procedure Date  . Tonsillectomy   . Scoliosis     x2  . Hip surgery   . Right inguinal hernia   . Hernia repair   . Colonoscopy 2010    [facility]    Family History  Problem Relation Age of Onset  . Heart failure Mother   . Cancer Father   . Cancer Sister   . Cancer Brother   . Coronary artery disease Brother     History   Social History  . Marital Status: Married    Spouse Name: N/A    Number of Children: N/A  . Years of Education: N/A   Occupational History  . Not on file.    Social History Main Topics  . Smoking status: Never Smoker   . Smokeless tobacco: Not on file  . Alcohol Use: No  . Drug Use: No  . Sexually Active: Yes   Other Topics Concern  . Not on file   Social History Narrative  . No narrative on file    Review of Systems:  All systems reviewed.  They are negative to the above problem except as previously stated.  EKG: SB 57 LVH Vital Signs: BP 130/80  Pulse 57  Ht 5\' 10"  (1.778 m)  Wt 177 lb (80.287 kg)  BMI 25.40 kg/m2  Physical Exam  HEENT:  Normocephalic, atraumatic. EOMI, PERRLA.  Neck: JVP is normal. No thyromegaly. No bruits.  Lungs: clear to auscultation. No rales no wheezes.  Heart: Regular rate and rhythm. Normal S1, S2. No S3.   No significant murmurs. PMI not displaced.  Abdomen:  Supple,  nontender. Normal bowel sounds. No masses. No hepatomegaly.  Extremities:   Good distal pulses throughout. No lower extremity edema.  Musculoskeletal :moving all extremities.  Neuro:   alert and oriented x3.  CN II-XII grossly intact.  EKG:  SB  57 bpm.  RBBB.  LVH. Assessment and Plan:  1.  HTN.  Adequate control   2.  HL.  Lipids are not optimal.  BUt carotids normal in 2010. Work on diet.  Keep on red yeast rice.  INcrease activity.

## 2011-12-12 NOTE — Patient Instructions (Signed)
Your physician wants you to follow-up in: 1 year. You will receive a reminder letter in the mail two months in advance. If you don't receive a letter, please call our office to schedule the follow-up appointment.  

## 2012-01-12 ENCOUNTER — Telehealth: Payer: Self-pay | Admitting: *Deleted

## 2012-01-12 NOTE — Telephone Encounter (Signed)
Called patient's wife and advised per Dr.Ross that he could increase Red Yeast Rice to 1200mg  2 times per day. She will advise him and fix his pill box.

## 2012-04-23 ENCOUNTER — Other Ambulatory Visit: Payer: Medicare Other

## 2012-04-23 DIAGNOSIS — Z23 Encounter for immunization: Secondary | ICD-10-CM

## 2012-04-23 MED ORDER — INFLUENZA VIRUS VACC SPLIT PF IM SUSP: 0.5000 mL | Freq: Once | INTRAMUSCULAR | Status: AC

## 2012-11-15 ENCOUNTER — Other Ambulatory Visit: Payer: Self-pay | Admitting: Family Medicine

## 2012-11-15 ENCOUNTER — Encounter: Payer: Self-pay | Admitting: Family Medicine

## 2012-11-15 ENCOUNTER — Ambulatory Visit (INDEPENDENT_AMBULATORY_CARE_PROVIDER_SITE_OTHER): Payer: Medicare Other | Admitting: Family Medicine

## 2012-11-15 VITALS — BP 124/80 | HR 53 | Ht 69.0 in | Wt 165.0 lb

## 2012-11-15 DIAGNOSIS — R7309 Other abnormal glucose: Secondary | ICD-10-CM

## 2012-11-15 DIAGNOSIS — M4120 Other idiopathic scoliosis, site unspecified: Secondary | ICD-10-CM

## 2012-11-15 DIAGNOSIS — R7302 Impaired glucose tolerance (oral): Secondary | ICD-10-CM

## 2012-11-15 DIAGNOSIS — I10 Essential (primary) hypertension: Secondary | ICD-10-CM

## 2012-11-15 DIAGNOSIS — Z0000 Encounter for general adult medical examination without abnormal findings: Secondary | ICD-10-CM

## 2012-11-15 DIAGNOSIS — E785 Hyperlipidemia, unspecified: Secondary | ICD-10-CM

## 2012-11-15 DIAGNOSIS — Z79899 Other long term (current) drug therapy: Secondary | ICD-10-CM

## 2012-11-15 LAB — CBC WITH DIFFERENTIAL/PLATELET
Basophils Absolute: 0 10*3/uL (ref 0.0–0.1)
HCT: 43.4 % (ref 39.0–52.0)
Hemoglobin: 15 g/dL (ref 13.0–17.0)
Lymphocytes Relative: 35 % (ref 12–46)
Monocytes Absolute: 0.3 10*3/uL (ref 0.1–1.0)
Monocytes Relative: 9 % (ref 3–12)
Neutro Abs: 2.1 10*3/uL (ref 1.7–7.7)
Neutrophils Relative %: 51 % (ref 43–77)
WBC: 4 10*3/uL (ref 4.0–10.5)

## 2012-11-15 LAB — COMPREHENSIVE METABOLIC PANEL
ALT: 19 U/L (ref 0–53)
AST: 20 U/L (ref 0–37)
Albumin: 4.4 g/dL (ref 3.5–5.2)
BUN: 18 mg/dL (ref 6–23)
CO2: 29 mEq/L (ref 19–32)
Calcium: 9.7 mg/dL (ref 8.4–10.5)
Chloride: 100 mEq/L (ref 96–112)
Creat: 0.61 mg/dL (ref 0.50–1.35)
Potassium: 4.2 mEq/L (ref 3.5–5.3)

## 2012-11-15 LAB — LIPID PANEL
Cholesterol: 181 mg/dL (ref 0–200)
HDL: 54 mg/dL (ref >39)
Total CHOL/HDL Ratio: 3.4 Ratio

## 2012-11-15 LAB — POCT URINALYSIS DIPSTICK
Bilirubin, UA: NEGATIVE
Ketones, UA: NEGATIVE
Leukocytes, UA: NEGATIVE
Protein, UA: NEGATIVE
Spec Grav, UA: 1.01

## 2012-11-15 NOTE — Progress Notes (Signed)
  Subjective:    Patient ID: [name], male    DOB: 05-Dec-1945, 67 y.o.   MRN: [id number]  HPI He is here for complete examination. He has no particular concerns or complaints. His work and home life are going quite well. He and his wife take care of their daughter who has Angelman's syndrome. Smoking and drinking were reviewed. Family history is unchanged. He continues on medications listed in the chart. 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   Review of Systems Negative except as above    Objective:   Physical Exam BP 124/80  Pulse 53  Ht 5\' 9"  (1.753 m)  Wt 165 lb (74.844 kg)  BMI 24.36 kg/m2  General Appearance:    Alert, cooperative, no distress, appears stated age  Head:    Normocephalic, without obvious abnormality, atraumatic  Eyes:    PERRL, conjunctiva/corneas clear, EOM's intact, fundi    benign  Ears:    Normal TM's and external ear canals  Nose:   Nares normal, mucosa normal, no drainage or sinus   tenderness  Throat:   Lips, mucosa, and tongue normal; teeth and gums normal  Neck:   Supple, no lymphadenopathy;  thyroid:  no   enlargement/tenderness/nodules; no carotid   bruit or JVD  Back:    Spine nontender, no curvature, ROM normal, no CVA     tenderness  Lungs:     Clear to auscultation bilaterally without wheezes, rales or     ronchi; respirations unlabored  Chest Wall:    No tenderness or deformity   Heart:    Regular rate and rhythm, S1 and S2 normal, no murmur, rub   or gallop  Breast Exam:    No chest wall tenderness, masses or gynecomastia  Abdomen:     Soft, non-tender, nondistended, normoactive bowel sounds,    no masses, no hepatosplenomegaly  Genitalia:  deferred  Rectal:  deferred  Extremities:   No clubbing, cyanosis or edema  Pulses:   2+ and symmetric all extremities  Skin:   Skin color, texture, turgor normal, no rashes or lesions  Lymph nodes:   Cervical,  supraclavicular, and axillary nodes normal  Neurologic:   CNII-XII intact, normal strength, sensation and gait; reflexes 2+ and symmetric throughout          Psych:   Normal mood, affect, hygiene and grooming.          Assessment & Plan:  Routine general medical examination at a health care facility - Plan: CBC with Differential, Comprehensive metabolic panel, Lipid panel, Urinalysis Dipstick  Idiopathic scoliosis  DYSLIPIDEMIA - Plan: Lipid panel  HYPERTENSION - Plan: CBC with Differential, Comprehensive metabolic panel  Encounter for long-term (current) use of other medications discussed the fact that he can get his followup with cardiac as well as neurologic to my office alone however he prefers to continue to be seen.

## 2012-11-16 LAB — HEMOGLOBIN A1C: Hgb A1c MFr Bld: 5.5 % (ref <5.7)

## 2012-12-07 NOTE — Progress Notes (Signed)
  Subjective:    Patient ID: [name], male    DOB: 11/29/1945, 67 y.o.   MRN: [id number]  HPI    Review of Systems     Objective:   Physical Exam        Assessment & Plan:  He has a history of previous elevated glucose

## 2012-12-16 ENCOUNTER — Other Ambulatory Visit: Payer: Self-pay | Admitting: Family Medicine

## 2013-01-27 ENCOUNTER — Ambulatory Visit: Payer: Medicare Other | Admitting: Internal Medicine

## 2013-02-11 ENCOUNTER — Ambulatory Visit: Payer: Medicare Other | Admitting: Internal Medicine

## 2013-02-16 ENCOUNTER — Other Ambulatory Visit: Payer: Self-pay

## 2013-04-01 ENCOUNTER — Ambulatory Visit (INDEPENDENT_AMBULATORY_CARE_PROVIDER_SITE_OTHER): Payer: Medicare Other | Admitting: Internal Medicine

## 2013-04-01 ENCOUNTER — Encounter: Payer: Self-pay | Admitting: Internal Medicine

## 2013-04-01 VITALS — BP 136/82 | HR 55 | Wt 166.0 lb

## 2013-04-01 DIAGNOSIS — I10 Essential (primary) hypertension: Secondary | ICD-10-CM

## 2013-04-01 DIAGNOSIS — E785 Hyperlipidemia, unspecified: Secondary | ICD-10-CM

## 2013-04-01 NOTE — Progress Notes (Signed)
HPI Pateint is a 67 year old with a history of HTN and HL.  ON red yeast rice  Had problems with Lipitor in past. Lipids in May 2014 LDL wsa 114, HDL 54.  Denies CP  No SOB  No palpitatoins Active  Has lost wt.   No Known Allergies  Current Outpatient Prescriptions  Medication Sig Dispense Refill  . Ascorbic Acid (VITAMIN C) 500 MG tablet Take 500 mg by mouth daily. 2 tabs daily       . aspirin 81 MG tablet Take 81 mg by mouth daily.        Marland Kitchen co-enzyme Q-10 30 MG capsule Take 50 mg by mouth daily.       . Multiple Vitamin (MULTIVITAMIN) capsule Take 1 capsule by mouth daily.        . Omega-3 Fatty Acids (FISH OIL PO) Take 2 tablets by mouth daily.        Marland Kitchen PROBIOTIC CAPS Take by mouth daily.        . Psyllium (METAMUCIL PO) Take 19 g by mouth. As directed      . Red Yeast Rice POWD 600 mg 2 tabs two times per day      . lisinopril-hydrochlorothiazide (PRINZIDE,ZESTORETIC) 20-12.5 MG per tablet take 1 tablet by mouth once daily  30 tablet  6   No current facility-administered medications for this visit.   Facility-Administered Medications Ordered in Other Visits  Medication Dose Route Frequency Provider Last Rate Last Dose  . influenza  inactive virus vaccine (FLUZONE/FLUARIX) injection 0.5 mL  0.5 mL Intramuscular Once Ronnald Nian, MD        Past Medical History  Diagnosis Date  . Dyslipidemia   . HTN (hypertension)   . Scoliosis     Past Surgical History  Procedure Laterality Date  . Tonsillectomy    . Scoliosis      x2  . Hip surgery    . Right inguinal hernia    . Hernia repair    . Colonoscopy  2010    [facility]    Family History  Problem Relation Age of Onset  . Heart failure Mother   . Cancer Father   . Cancer Sister   . Cancer Brother   . Coronary artery disease Brother     History   Social History  . Marital Status: Married    Spouse Name: N/A    Number of Children: N/A  . Years of Education: N/A   Occupational History  . Not on file.   Social  History Main Topics  . Smoking status: Never Smoker   . Smokeless tobacco: Not on file  . Alcohol Use: No  . Drug Use: No  . Sexual Activity: Yes   Other Topics Concern  . Not on file   Social History Narrative  . No narrative on file    Review of Systems:  All systems reviewed.  They are negative to the above problem except as previously stated.  EKG: SB 57 LVH Vital Signs: BP 136/82  Pulse 55  Wt 166 lb (75.297 kg)  BMI 24.5 kg/m2  Physical Exam Patietn is in NAD HEENT:  Normocephalic, atraumatic. EOMI, PERRLA.  Neck: JVP is normal. No thyromegaly. No bruits.  Lungs: clear to auscultation. No rales no wheezes.  Heart: Regular rate and rhythm. Normal S1, S2. No S3.   No significant murmurs. PMI not displaced.  Abdomen:  Supple, nontender. Normal bowel sounds. No masses. No hepatomegaly.  Extremities:  Good distal pulses throughout. No lower extremity edema.  Musculoskeletal :moving all extremities.  Neuro:   alert and oriented x3.  CN II-XII grossly intact.  EKG:  SB 55  RBBB LVH Assessment and Plan:  1.  HTN.  Adequate control   2.  HL.  Lipids are better than in 2013  Eating better  Has lost wt  BUt carotids normal in 2010. Continue to walk

## 2013-04-01 NOTE — Patient Instructions (Signed)
Continue same medications.   Your physician wants you to follow-up in: 12 months.  You will receive a reminder letter in the mail two months in advance. If you don't receive a letter, please call our office to schedule the follow-up appointment.  

## 2013-04-21 ENCOUNTER — Other Ambulatory Visit (INDEPENDENT_AMBULATORY_CARE_PROVIDER_SITE_OTHER): Payer: Medicare Other

## 2013-04-21 DIAGNOSIS — Z23 Encounter for immunization: Secondary | ICD-10-CM

## 2013-07-17 ENCOUNTER — Other Ambulatory Visit: Payer: Self-pay | Admitting: Family Medicine

## 2013-10-11 ENCOUNTER — Other Ambulatory Visit: Payer: Self-pay | Admitting: Family Medicine

## 2013-11-17 ENCOUNTER — Encounter: Payer: Self-pay | Admitting: Family Medicine

## 2013-11-17 ENCOUNTER — Ambulatory Visit (INDEPENDENT_AMBULATORY_CARE_PROVIDER_SITE_OTHER): Payer: Medicare Other | Admitting: Family Medicine

## 2013-11-17 VITALS — HR 68 | Ht 70.0 in | Wt 170.0 lb

## 2013-11-17 DIAGNOSIS — Z79899 Other long term (current) drug therapy: Secondary | ICD-10-CM

## 2013-11-17 DIAGNOSIS — I10 Essential (primary) hypertension: Secondary | ICD-10-CM

## 2013-11-17 DIAGNOSIS — Z0000 Encounter for general adult medical examination without abnormal findings: Secondary | ICD-10-CM

## 2013-11-17 DIAGNOSIS — E785 Hyperlipidemia, unspecified: Secondary | ICD-10-CM

## 2013-11-17 DIAGNOSIS — M4120 Other idiopathic scoliosis, site unspecified: Secondary | ICD-10-CM

## 2013-11-17 DIAGNOSIS — Z23 Encounter for immunization: Secondary | ICD-10-CM

## 2013-11-17 DIAGNOSIS — Z125 Encounter for screening for malignant neoplasm of prostate: Secondary | ICD-10-CM

## 2013-11-17 LAB — POCT URINALYSIS DIPSTICK
BILIRUBIN UA: NEGATIVE
Glucose, UA: NEGATIVE
KETONES UA: NEGATIVE
Leukocytes, UA: NEGATIVE
Nitrite, UA: NEGATIVE
PH UA: 6
RBC UA: NEGATIVE
Spec Grav, UA: 1.01
Urobilinogen, UA: NEGATIVE

## 2013-11-17 LAB — COMPREHENSIVE METABOLIC PANEL
ALK PHOS: 46 U/L (ref 39–117)
ALT: 72 U/L — AB (ref 0–53)
AST: 36 U/L (ref 0–37)
Albumin: 3.7 g/dL (ref 3.5–5.2)
BILIRUBIN TOTAL: 0.8 mg/dL (ref 0.2–1.2)
BUN: 17 mg/dL (ref 6–23)
CALCIUM: 9 mg/dL (ref 8.4–10.5)
CHLORIDE: 102 meq/L (ref 96–112)
CO2: 28 mEq/L (ref 19–32)
CREATININE: 0.61 mg/dL (ref 0.50–1.35)
Glucose, Bld: 99 mg/dL (ref 70–99)
Potassium: 4.5 mEq/L (ref 3.5–5.3)
Sodium: 137 mEq/L (ref 135–145)
Total Protein: 6.7 g/dL (ref 6.0–8.3)

## 2013-11-17 LAB — LIPID PANEL
CHOL/HDL RATIO: 4.4 ratio
Cholesterol: 150 mg/dL (ref 0–200)
HDL: 34 mg/dL — AB (ref >39)
LDL Cholesterol: 102 mg/dL — ABNORMAL HIGH (ref 0–99)
Triglycerides: 72 mg/dL (ref <150)
VLDL: 14 mg/dL (ref 0–40)

## 2013-11-17 MED ORDER — LISINOPRIL-HYDROCHLOROTHIAZIDE 20-12.5 MG PO TABS: ORAL_TABLET | ORAL | Status: DC

## 2013-11-17 NOTE — Progress Notes (Signed)
   Subjective:    Patient ID: Samuel Police Sr., male    DOB: [birth year], 68 y.o.   MRN: [id number]  [name] is a very pleasant 68 y.o. yo male who  has a past medical history of Dyslipidemia; HTN (hypertension); and Scoliosis. He presents today for an annual physical.   HPI  The patient is doing well overall and has no particular concerns or complaints. The patient does note however that two weeks ago he had one week of severe chills. This was accompanied with swelling of his knees, ankles and finger joints. The patient also reports a bad headache during that period. The patient reports no fever during that time. This has since resolved and he is has had no further instances of the fever or joint swelling.   His work and home life are going quite well. 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 He and his wife continue to take care of their daughter who has Angelman's syndrome and is doing well overall. The patient is a never smoker and doesn't presently drink alcohol. The patient continues on medications listed in the chart. He does walk regularly on the treadmill for exercise. He is seen yearly by urology and cardiology and both have stated the patient is doing well. He would like [place] to send a copy of blood work over to [facility] office today. At this point he is merely being followed for PSA, blood pressure and cholesterol by the subspecialists. The patient is UTD on his Tdap and zoster vaccinations. He had his last colonoscopy in 2010. The patient needs his second dose of pneumococcal 13 today.  Review of Systems is negative except per HPI.    Objective:   Physical Exam  Constitutional: Patient is well-developed, well-nourished, and in no distress. HENT: Head is normocephalic and atraumatic.  Mouth/Throat: Oropharynx is clear and moist without erythema or exudates Eyes: Conjunctivae and EOM are normal. Pupils  are equal, round, and reactive to light.  Neck: Normal range of motion. Neck supple.  Cardiovascular: Normal rate, regular rhythm. Exam reveals no murmurs, gallops and no friction rub.  Pulmonary/Chest: Effort normal and CTAB. No respiratory distress. No wheezes or ronchi.   Abdominal: Soft, non-tender and non-distended. There is no rebound or guarding. No HSM.  Neurological: Patient is alert and oriented to person, place, and time.  Reflex Scores: Biceps and Patellar reflexes were 2+ and equal bilaterally.  Skin: Skin is warm and dry. No rash noted.  Psychiatric: Affect normal.   Assessment & Plan:  Routine general medical examination at a health care facility - Plan: Urinalysis Dipstick, CBC with Differential, Comprehensive metabolic panel, Lipid panel  Idiopathic scoliosis  HYPERTENSION - Plan: CBC with Differential, Comprehensive metabolic panel, lisinopril-hydrochlorothiazide (PRINZIDE,ZESTORETIC) 20-12.5 MG per tablet  DYSLIPIDEMIA - Plan: Lipid panel  Special screening for malignant neoplasm of prostate - Plan: PSA, Medicare  Need for prophylactic vaccination against Streptococcus pneumoniae (pneumococcus) - Plan: Pneumococcal conjugate vaccine 13-valent  Encounter for long-term (current) use of other medications - Plan: Comprehensive metabolic panel, Lipid panel, lisinopril-hydrochlorothiazide (PRINZIDE,ZESTORETIC) 20-12.5 MG per tablet  Expressed to the patient that if he would like to continue to see his cardiologist or urologist if he could, however I can also follow his PSA, BP and lipid panels here in the office.

## 2013-11-17 NOTE — Progress Notes (Deleted)
   Subjective:    Patient ID: Samuel Police Sr., male    DOB: 1945/12/25, 68 y.o.   MRN: [id number]  HPI    Review of Systems     Objective:   Physical Exam        Assessment & Plan:

## 2013-11-18 LAB — CBC WITH DIFFERENTIAL/PLATELET
BASOS PCT: 3 % — AB (ref 0–1)
Basophils Absolute: 0.1 10*3/uL (ref 0.0–0.1)
EOS ABS: 0.2 10*3/uL (ref 0.0–0.7)
Eosinophils Relative: 5 % (ref 0–5)
HEMATOCRIT: 37 % — AB (ref 39.0–52.0)
Hemoglobin: 12.7 g/dL — ABNORMAL LOW (ref 13.0–17.0)
Lymphocytes Relative: 22 % (ref 12–46)
Lymphs Abs: 1 10*3/uL (ref 0.7–4.0)
MCH: 29.8 pg (ref 26.0–34.0)
MCHC: 34.3 g/dL (ref 30.0–36.0)
MCV: 86.9 fL (ref 78.0–100.0)
MONO ABS: 0.6 10*3/uL (ref 0.1–1.0)
Monocytes Relative: 13 % — ABNORMAL HIGH (ref 3–12)
Neutro Abs: 2.5 10*3/uL (ref 1.7–7.7)
Neutrophils Relative %: 57 % (ref 43–77)
Platelets: 387 10*3/uL (ref 150–400)
RBC: 4.26 MIL/uL (ref 4.22–5.81)
RDW: 13.5 % (ref 11.5–15.5)
WBC: 4.4 10*3/uL (ref 4.0–10.5)

## 2013-11-18 LAB — PSA, MEDICARE: PSA: 0.72 ng/mL (ref <=4.00)

## 2014-01-02 ENCOUNTER — Ambulatory Visit (INDEPENDENT_AMBULATORY_CARE_PROVIDER_SITE_OTHER): Payer: Medicare Other | Admitting: Family Medicine

## 2014-01-02 ENCOUNTER — Encounter: Payer: Self-pay | Admitting: Family Medicine

## 2014-01-02 VITALS — BP 100/70 | HR 60 | Temp 97.9°F | Ht 69.0 in | Wt 169.0 lb

## 2014-01-02 DIAGNOSIS — J069 Acute upper respiratory infection, unspecified: Secondary | ICD-10-CM

## 2014-01-02 DIAGNOSIS — R059 Cough, unspecified: Secondary | ICD-10-CM

## 2014-01-02 DIAGNOSIS — R05 Cough: Secondary | ICD-10-CM

## 2014-01-02 NOTE — Patient Instructions (Signed)
  Drink plenty of fluids. Start taking guaifenesin (found in Mucinex, Robitussin)--take as directed on label.  This is an expectorant to loosen up the  Mucus.  You may use a medication that contains dextromethorphan (ie DM in combination medications, or in Delsym syrup)--this is a cough suppressant that you can use only if needed coughing.

## 2014-01-02 NOTE — Progress Notes (Signed)
Chief Complaint  Patient presents with  . Cough    coughs up thick white mucus about every 30 minutes-was green in color last week. 8-9 days total of coughing.    Last week he started with itchy throat, post nasal drainage.  He had some hoarseness last week, chest congestion.  He had some discolored mucus (dark green).  Phlegm remains thick, but is now clear-white in color.  Denies sinus pain, headaches, sneezing.  Hasn't been taking any medications, just some cough drops.  Last week he used delsym syrup. He continues to cough. He is going to the beach and wanted to make sure that antibiotics weren't needed for ongoing cough.  Past Medical History  Diagnosis Date  . Dyslipidemia   . HTN (hypertension)   . Scoliosis    Past Surgical History  Procedure Laterality Date  . Tonsillectomy    . Scoliosis      x2  . Hip surgery    . Right inguinal hernia    . Hernia repair    . Colonoscopy  2010    [facility]   History   Social History  . Marital Status: Married    Spouse Name: N/A    Number of Children: N/A  . Years of Education: N/A   Occupational History  . Not on file.   Social History Main Topics  . Smoking status: Never Smoker   . Smokeless tobacco: Not on file  . Alcohol Use: No  . Drug Use: No  . Sexual Activity: Yes   Other Topics Concern  . Not on file   Social History Narrative  . No narrative on file   Current Outpatient Prescriptions on File Prior to Visit  Medication Sig Dispense Refill  . Ascorbic Acid (VITAMIN C) 500 MG tablet Take 500 mg by mouth daily. 2 tabs daily       . aspirin 81 MG tablet Take 81 mg by mouth daily.        Marland Kitchen co-enzyme Q-10 30 MG capsule Take 50 mg by mouth daily.       Marland Kitchen lisinopril-hydrochlorothiazide (PRINZIDE,ZESTORETIC) 20-12.5 MG per tablet take 1 tablet by mouth once daily  90 tablet  3  . Multiple Vitamin (MULTIVITAMIN) capsule Take 1 capsule by mouth daily.        . Omega-3 Fatty Acids (FISH OIL PO) Take 2 tablets by mouth  daily.        Marland Kitchen PROBIOTIC CAPS Take by mouth daily.        . Psyllium (METAMUCIL PO) Take 19 g by mouth. As directed      . Red Yeast Rice POWD 600 mg 2 tabs two times per day       Current Facility-Administered Medications on File Prior to Visit  Medication Dose Route Frequency Provider Last Rate Last Dose  . influenza  inactive virus vaccine (FLUZONE/FLUARIX) injection 0.5 mL  0.5 mL Intramuscular Once [name], MD       No Known Allergies  ROS:  No fevers, chills, headaches, dizziness, chest pain, shortness of breath, nausea, vomiting, bleeding/bruising, rashes, or other complaints.  PHYSICAL EXAM BP 100/70  Pulse 60  Temp(Src) 97.9 F (36.6 C) (Oral)  Ht 5\' 9"  (1.753 m)  Wt 169 lb (76.658 kg)  BMI 24.95 kg/m2 Well developed male in no distress.  No coughing during visit HEENT:  PERRL, EOMI, conjunctiva clear.  TM's and EACs normal.  OP is clear, moist mucus membranes.  Sinuses nontender. Nasal mucosa without erythema  or purulence.  Neck: no lymphadenopathy or mass Heart: regular rate and rhythm Lungs: clear bilaterally Skin: no rash Back: significant scoliosis noted in upper back  ASSESSMENT/PLAN:  Cough  Acute upper respiratory infections of unspecified site  Viral URI, with improvement in symptoms (color, hoarseness, fatigue), but persistent cough. Reviewed signs/symptoms of bacterial infection and to call/return if they develop. Supportive measures reviewed.   Drink plenty of fluids. Start taking guaifenesin (found in Mucinex, Robitussin)--take as directed on label.  This is an expectorant to loosen up the  Mucus.  You may use a medication that contains dextromethorphan (ie DM in combination medications, or in Delsym syrup)--this is a cough suppressant that you can use only if needed coughing.

## 2014-05-11 ENCOUNTER — Other Ambulatory Visit (INDEPENDENT_AMBULATORY_CARE_PROVIDER_SITE_OTHER): Payer: Medicare Other

## 2014-05-11 DIAGNOSIS — Z23 Encounter for immunization: Secondary | ICD-10-CM

## 2014-05-28 NOTE — Progress Notes (Signed)
HPI Pateint is a 68 year old with a history of HTN and HL.  ON red yeast rice  Had problems with Lipitor in past  I saw the patient in fall 2014 Patinet denies CP  No SOB  No dizziness.  Had to cut back on walking some because of R knee problems    LDL 102  HDL 34   No Known Allergies  Current Outpatient Prescriptions  Medication Sig Dispense Refill  . Ascorbic Acid (VITAMIN C) 500 MG tablet Take 500 mg by mouth daily. 2 tabs daily     . aspirin 81 MG tablet Take 81 mg by mouth daily.      Marland Kitchen co-enzyme Q-10 30 MG capsule Take 50 mg by mouth daily.     Marland Kitchen lisinopril-hydrochlorothiazide (PRINZIDE,ZESTORETIC) 20-12.5 MG per tablet take 1 tablet by mouth once daily 90 tablet 3  . Multiple Vitamin (MULTIVITAMIN) capsule Take 1 capsule by mouth daily.      . Omega-3 Fatty Acids (FISH OIL PO) Take 2 tablets by mouth daily.      Marland Kitchen PROBIOTIC CAPS Take by mouth daily.      . Psyllium (METAMUCIL PO) Take 19 g by mouth. As directed    . Red Yeast Rice POWD 600 mg 2 tabs two times per day     No current facility-administered medications for this visit.   Facility-Administered Medications Ordered in Other Visits  Medication Dose Route Frequency Provider Last Rate Last Dose  . influenza  inactive virus vaccine (FLUZONE/FLUARIX) injection 0.5 mL  0.5 mL Intramuscular Once [name], MD        Past Medical History  Diagnosis Date  . Dyslipidemia   . HTN (hypertension)   . Scoliosis     Past Surgical History  Procedure Laterality Date  . Tonsillectomy    . Scoliosis      x2  . Hip surgery    . Right inguinal hernia    . Hernia repair    . Colonoscopy  2010    [facility]    Family History  Problem Relation Age of Onset  . Heart failure Mother   . Cancer Father   . Cancer Sister   . Cancer Brother   . Coronary artery disease Brother     History   Social History  . Marital Status: Married    Spouse Name: N/A    Number of Children: N/A  . Years of Education: N/A    Occupational History  . Not on file.   Social History Main Topics  . Smoking status: Never Smoker   . Smokeless tobacco: Not on file  . Alcohol Use: No  . Drug Use: No  . Sexual Activity: Yes   Other Topics Concern  . Not on file   Social History Narrative    Review of Systems:  All systems reviewed.  They are negative to the above problem except as previously stated.  Vital Signs: BP 160/100 mmHg  Pulse 50  Ht 5\' 9"  (1.753 m)  Wt 175 lb (79.379 kg)  BMI 25.83 kg/m2 BP 160/96 on my check   Physical Exam Patietn is in NAD HEENT:  Normocephalic, atraumatic. EOMI, PERRLA.  Neck: JVP is normal. No thyromegaly. No bruits.  Lungs: clear to auscultation. No rales no wheezes.  Heart: Regular rate and rhythm. Normal S1, S2. No S3.   No significant murmurs. PMI not displaced.  Abdomen:  Supple, nontender. Normal bowel sounds. No masses. No hepatomegaly.  Extremities:  Good distal pulses throughout. No lower extremity edema.  Musculoskeletal :moving all extremities.  Neuro:   alert and oriented x3.  CN II-XII grossly intact.  EKG:  SB 50  RBBB LVH Assessment and Plan:  1.  HTN.  BP is high  I would recomm that he take BP at home  He has a cuff  Bring in cuff and diary in 4 wks  Will repeat, calibrate and review     2.  HL.  Keep on current regimen    Try to walk

## 2014-05-29 ENCOUNTER — Ambulatory Visit (INDEPENDENT_AMBULATORY_CARE_PROVIDER_SITE_OTHER): Payer: Medicare Other | Admitting: Internal Medicine

## 2014-05-29 ENCOUNTER — Encounter: Payer: Self-pay | Admitting: Internal Medicine

## 2014-05-29 VITALS — BP 160/100 | HR 50 | Ht 69.0 in | Wt 175.0 lb

## 2014-05-29 DIAGNOSIS — I10 Essential (primary) hypertension: Secondary | ICD-10-CM

## 2014-05-29 NOTE — Patient Instructions (Signed)
Your physician recommends that you continue on your current medications as directed. Please refer to the Current Medication list given to you today. Your physician recommends that you schedule a follow-up appointment on 06/26/14 FOR A NURSE ROOM VISIT WITH NURSE FOR BLOOD PRESSURE CHECK (ON A DAY DR. Harrington Challenger IS IN CLINIC)  PLEASE CHECK YOUR BLOOD PRESSURE AT HOME AND RECORD.  BRING THE RECORD WITH YOU TO THE NURSE VISIT.

## 2014-06-26 ENCOUNTER — Ambulatory Visit
Admission: RE | Admit: 2014-06-26 | Discharge: 2014-06-26 | Disposition: A | Discharge status: Home / Self Care | Payer: Medicare Other | Source: Ambulatory Visit | Attending: Family Medicine | Admitting: Family Medicine

## 2014-06-26 ENCOUNTER — Encounter: Payer: Self-pay | Admitting: Family Medicine

## 2014-06-26 ENCOUNTER — Ambulatory Visit (INDEPENDENT_AMBULATORY_CARE_PROVIDER_SITE_OTHER): Payer: Medicare Other

## 2014-06-26 ENCOUNTER — Ambulatory Visit (INDEPENDENT_AMBULATORY_CARE_PROVIDER_SITE_OTHER): Payer: Medicare Other | Admitting: Family Medicine

## 2014-06-26 VITALS — BP 163/83 | HR 59 | Wt 171.0 lb

## 2014-06-26 VITALS — BP 120/70 | HR 58 | Wt 171.0 lb

## 2014-06-26 DIAGNOSIS — M25461 Effusion, right knee: Secondary | ICD-10-CM

## 2014-06-26 DIAGNOSIS — M25561 Pain in right knee: Secondary | ICD-10-CM

## 2014-06-26 DIAGNOSIS — I10 Essential (primary) hypertension: Secondary | ICD-10-CM

## 2014-06-26 NOTE — Progress Notes (Signed)
Patient here for BP check today. Brings in his own BP monitor for comparison of readings. He also brings a list of BP readings at home, and from a [place]. Manual BP 168 /94; his auto cuff 163/ 83. Per Dr. Harrington Challenger, since his readings are good at home, there will be no change in medications. Patient voices good understanding.

## 2014-06-26 NOTE — Progress Notes (Signed)
   Subjective:    Patient ID: [name], male    DOB: 1946-05-22, 68 y.o.   MRN: [id number]  HPI He complains of a a three-week history of right knee discomfort. He is difficult to get a good history from. He has definitely noticed some swelling but cannot really fully elucidate whether he is having a popping or grinding or giving way. No other joints are involved.   Review of Systems     Objective:   Physical Exam Alert and in no distress. Right knee does show a moderate sized effusion. No tenderness palpation. It is not warm and not tender. Joint line is negative for pain. McMurray's testing negative. Anterior drawer negative. Medial and lateral collateral ligaments intact.       Assessment & Plan:  Knee effusion, right - Plan: DG Knee Complete 4 Views Right  Right knee pain - Plan: DG Knee Complete 4 Views Right  The x-ray showed no major changes. I will have him return for follow-up steroid injection and to drop some of his fluid.

## 2014-06-27 ENCOUNTER — Ambulatory Visit (INDEPENDENT_AMBULATORY_CARE_PROVIDER_SITE_OTHER): Payer: Medicare Other | Admitting: Family Medicine

## 2014-06-27 DIAGNOSIS — M2548 Effusion, other site: Secondary | ICD-10-CM

## 2014-06-27 DIAGNOSIS — M25469 Effusion, unspecified knee: Secondary | ICD-10-CM

## 2014-06-27 NOTE — Progress Notes (Signed)
   Subjective:    Patient ID: [name], male    DOB: Nov 20, 1945, 68 y.o.   MRN: [id number]  HPI He is here for a recheck. Recent x-ray showed minimal arthritic changes. He continues to have difficulty with an effusion.   Review of Systems     Objective:   Physical Exam Effusion is noted but is not warm or tender.       Assessment & Plan:  Joint effusion of knee - Plan: Body fluid culture, Synovial Fluid Panel  the knee was prepped laterally with Betadine. A 1 mL injection of Xylocaine was placed into the skin. An 18-gauge needle was inserted into the synovium and 30 mL of clear yellow fluid was extracted without difficulty. He was then given 40 mg of Kenalog and 2 mL of Xylocaine. Discussed with him that if this does not work we will then need to proceed further with evaluation of an underlying cause. At this point there is no major pathologic process going on.

## 2014-06-28 LAB — SYNOVIAL FLUID PANEL
Crystals, Fluid: NONE SEEN
EOSINOPHILS-SYNOVIAL: 0 % (ref 0–1)
Lymphocytes-Synovial Fld: 60 % — ABNORMAL HIGH (ref 0–20)
Monocyte/Macrophage: 20 % — ABNORMAL LOW (ref 50–90)
Neutrophil, Synovial: 20 % (ref 0–25)
WBC, Synovial: 175 cu mm (ref 0–200)

## 2014-07-01 LAB — BODY FLUID CULTURE
GRAM STAIN: NONE SEEN
Gram Stain: NONE SEEN
ORGANISM ID, BACTERIA: NO GROWTH
Organism ID, Bacteria: NO GROWTH

## 2014-07-20 ENCOUNTER — Telehealth: Payer: Self-pay | Admitting: Internal Medicine

## 2014-07-20 ENCOUNTER — Telehealth: Payer: Self-pay | Admitting: Family Medicine

## 2014-07-20 NOTE — Telephone Encounter (Signed)
The results were all negative

## 2014-07-20 NOTE — Telephone Encounter (Signed)
Please call pt wife regarding results from knee culture.  Pt ph [phone]

## 2014-07-20 NOTE — Telephone Encounter (Signed)
Wife was notified of results

## 2014-07-20 NOTE — Telephone Encounter (Signed)
Wife called and wants to know what the results from the fluid culture from the patient knee was

## 2014-09-22 ENCOUNTER — Other Ambulatory Visit: Payer: Self-pay | Admitting: Family Medicine

## 2014-12-12 ENCOUNTER — Encounter: Payer: Self-pay | Admitting: Family Medicine

## 2014-12-12 ENCOUNTER — Ambulatory Visit (INDEPENDENT_AMBULATORY_CARE_PROVIDER_SITE_OTHER): Payer: Medicare Other | Admitting: Family Medicine

## 2014-12-12 VITALS — BP 128/84 | HR 60 | Ht 69.0 in | Wt 170.8 lb

## 2014-12-12 DIAGNOSIS — Z23 Encounter for immunization: Secondary | ICD-10-CM

## 2014-12-12 DIAGNOSIS — Z0000 Encounter for general adult medical examination without abnormal findings: Secondary | ICD-10-CM

## 2014-12-12 DIAGNOSIS — Z418 Encounter for other procedures for purposes other than remedying health state: Secondary | ICD-10-CM | POA: Diagnosis not present

## 2014-12-12 DIAGNOSIS — M4120 Other idiopathic scoliosis, site unspecified: Secondary | ICD-10-CM

## 2014-12-12 DIAGNOSIS — I10 Essential (primary) hypertension: Secondary | ICD-10-CM

## 2014-12-12 DIAGNOSIS — E785 Hyperlipidemia, unspecified: Secondary | ICD-10-CM

## 2014-12-12 DIAGNOSIS — Z79899 Other long term (current) drug therapy: Secondary | ICD-10-CM

## 2014-12-12 DIAGNOSIS — Z299 Encounter for prophylactic measures, unspecified: Secondary | ICD-10-CM

## 2014-12-12 LAB — COMPREHENSIVE METABOLIC PANEL
ALT: 21 U/L (ref 0–53)
AST: 19 U/L (ref 0–37)
Albumin: 4.3 g/dL (ref 3.5–5.2)
Alkaline Phosphatase: 46 U/L (ref 39–117)
BUN: 17 mg/dL (ref 6–23)
CHLORIDE: 99 meq/L (ref 96–112)
CO2: 30 meq/L (ref 19–32)
Calcium: 9.5 mg/dL (ref 8.4–10.5)
Creat: 0.6 mg/dL (ref 0.50–1.35)
Glucose, Bld: 98 mg/dL (ref 70–99)
Potassium: 4.1 mEq/L (ref 3.5–5.3)
Sodium: 139 mEq/L (ref 135–145)
TOTAL PROTEIN: 7.2 g/dL (ref 6.0–8.3)
Total Bilirubin: 0.7 mg/dL (ref 0.2–1.2)

## 2014-12-12 LAB — CBC WITH DIFFERENTIAL/PLATELET
BASOS ABS: 0.1 10*3/uL (ref 0.0–0.1)
Basophils Relative: 1 % (ref 0–1)
EOS ABS: 0.3 10*3/uL (ref 0.0–0.7)
EOS PCT: 4 % (ref 0–5)
HCT: 43 % (ref 39.0–52.0)
HEMOGLOBIN: 14.7 g/dL (ref 13.0–17.0)
Lymphocytes Relative: 25 % (ref 12–46)
Lymphs Abs: 1.8 10*3/uL (ref 0.7–4.0)
MCH: 29.9 pg (ref 26.0–34.0)
MCHC: 34.2 g/dL (ref 30.0–36.0)
MCV: 87.4 fL (ref 78.0–100.0)
MONO ABS: 0.7 10*3/uL (ref 0.1–1.0)
MONOS PCT: 10 % (ref 3–12)
MPV: 9.3 fL (ref 8.6–12.4)
Neutro Abs: 4.2 10*3/uL (ref 1.7–7.7)
Neutrophils Relative %: 60 % (ref 43–77)
Platelets: 283 10*3/uL (ref 150–400)
RBC: 4.92 MIL/uL (ref 4.22–5.81)
RDW: 13 % (ref 11.5–15.5)
WBC: 7 10*3/uL (ref 4.0–10.5)

## 2014-12-12 LAB — POCT URINALYSIS DIPSTICK
BILIRUBIN UA: NEGATIVE
Blood, UA: NEGATIVE
Glucose, UA: NEGATIVE
KETONES UA: NEGATIVE
LEUKOCYTES UA: NEGATIVE
NITRITE UA: NEGATIVE
PH UA: 6
Protein, UA: NEGATIVE
Spec Grav, UA: >=1.03
Urobilinogen, UA: NEGATIVE

## 2014-12-12 LAB — LIPID PANEL
Cholesterol: 185 mg/dL (ref 0–200)
HDL: 49 mg/dL (ref >=40)
LDL CALC: 122 mg/dL — AB (ref 0–99)
TRIGLYCERIDES: 69 mg/dL (ref <150)
Total CHOL/HDL Ratio: 3.8 Ratio
VLDL: 14 mg/dL (ref 0–40)

## 2014-12-12 NOTE — Progress Notes (Signed)
Subjective:    Patient ID: [name]., male    DOB: 1945-09-03, 69 y.o.   MRN: [id number]  HPI He is here for complete examination. He does have underlying scoliosis whatever this gives him noted difficulty at all. He continues on his blood pressure medication as well as using red yeast rice for his cholesterol. He has no particular concerns or complaints. He keeps himself physically active. His marriage is going well. Does have a previous history of a prostate nodule however review of the fact that he had a biopsy as well as follow-up PSAs about been normal. He also sees cardiology mainly for blood pressure and cholesterol. His family and social history as well as health maintenance was reviewed. He has had no chest pain, shortness breath, urinary symptoms   Review of Systems  All other systems reviewed and are negative.      Objective:   Physical Exam BP 128/84 mmHg  Pulse 60  Ht 5\' 9"  (1.753 m)  Wt 170 lb 12.8 oz (77.474 kg)  BMI 25.21 kg/m2  SpO2 97%  General Appearance:    Alert, cooperative, no distress, appears stated age  Head:    Normocephalic, without obvious abnormality, atraumatic  Eyes:    PERRL, conjunctiva/corneas clear, EOM's intact,   Ears:    Normal TM's and external ear canals  Nose:   Nares normal, mucosa normal, no drainage or sinus   tenderness  Throat:   Lips, mucosa, and tongue normal; teeth and gums normal  Neck:   Supple, no lymphadenopathy;  thyroid:  no   enlargement/tenderness/nodules; no carotid   bruit or JVD  Back:    Spine nontender, no curvature, ROM normal, no CVA     tenderness  Lungs:     Clear to auscultation bilaterally without wheezes, rales or     ronchi; respirations unlabored  Chest Wall:    No tenderness or deformity   Heart:    Regular rate and rhythm, S1 and S2 normal, no murmur, rub   or gallop  Breast Exam:    No chest wall tenderness, masses or gynecomastia  Abdomen:     Soft, non-tender, nondistended, normoactive bowel  sounds,    no masses, no hepatosplenomegaly        Extremities:   No clubbing, cyanosis or edema  Pulses:   2+ and symmetric all extremities  Skin:   Skin color, texture, turgor normal, no rashes or lesions  Lymph nodes:   Cervical, supraclavicular, and axillary nodes normal  Neurologic:   CNII-XII intact, normal strength, sensation and gait; reflexes 2+ and symmetric throughout          Psych:   Normal mood, affect, hygiene and grooming.          Assessment & Plan:  Routine general medical examination at a health care facility - Plan: POCT Urinalysis Dipstick, Visual acuity screening, CBC with Differential/Platelet, Comprehensive metabolic panel, Lipid panel  Idiopathic scoliosis  Essential hypertension - Plan: CBC with Differential/Platelet, Comprehensive metabolic panel  Hyperlipidemia LDL goal <100 - Plan: Lipid panel  Encounter for long-term (current) use of medications - Plan: CBC with Differential/Platelet, Comprehensive metabolic panel, Lipid panel  Need for prophylactic measure Discussed follow-up with me versus continuing with a cardiologist and urology. Sincere mainly monitoring I recommended that he follow-up with me and if we run into trouble in either those areas, referral would be easier. He is comfortable with that. Encouraged him to remain physically active. Also discussed the advanced  directive with him. Gave him information concerning this including paperwork. He will discuss this with his wife.

## 2014-12-18 DIAGNOSIS — H53133 Sudden visual loss, bilateral: Secondary | ICD-10-CM | POA: Diagnosis not present

## 2014-12-18 DIAGNOSIS — H25013 Cortical age-related cataract, bilateral: Secondary | ICD-10-CM | POA: Diagnosis not present

## 2014-12-21 ENCOUNTER — Telehealth: Payer: Self-pay | Admitting: Family Medicine

## 2014-12-21 NOTE — Telephone Encounter (Signed)
Pt wife called for copy of lab and visit.  She is on HIPPA

## 2015-01-06 IMAGING — CR DG KNEE COMPLETE 4+V*R*
4 series · 4 of 4 positions shown · non-contrast
Comparison: None.

CLINICAL DATA: Pain and swelling for 2 months.

EXAM:
RIGHT KNEE - COMPLETE 4+ VIEW

[t knee ap right]
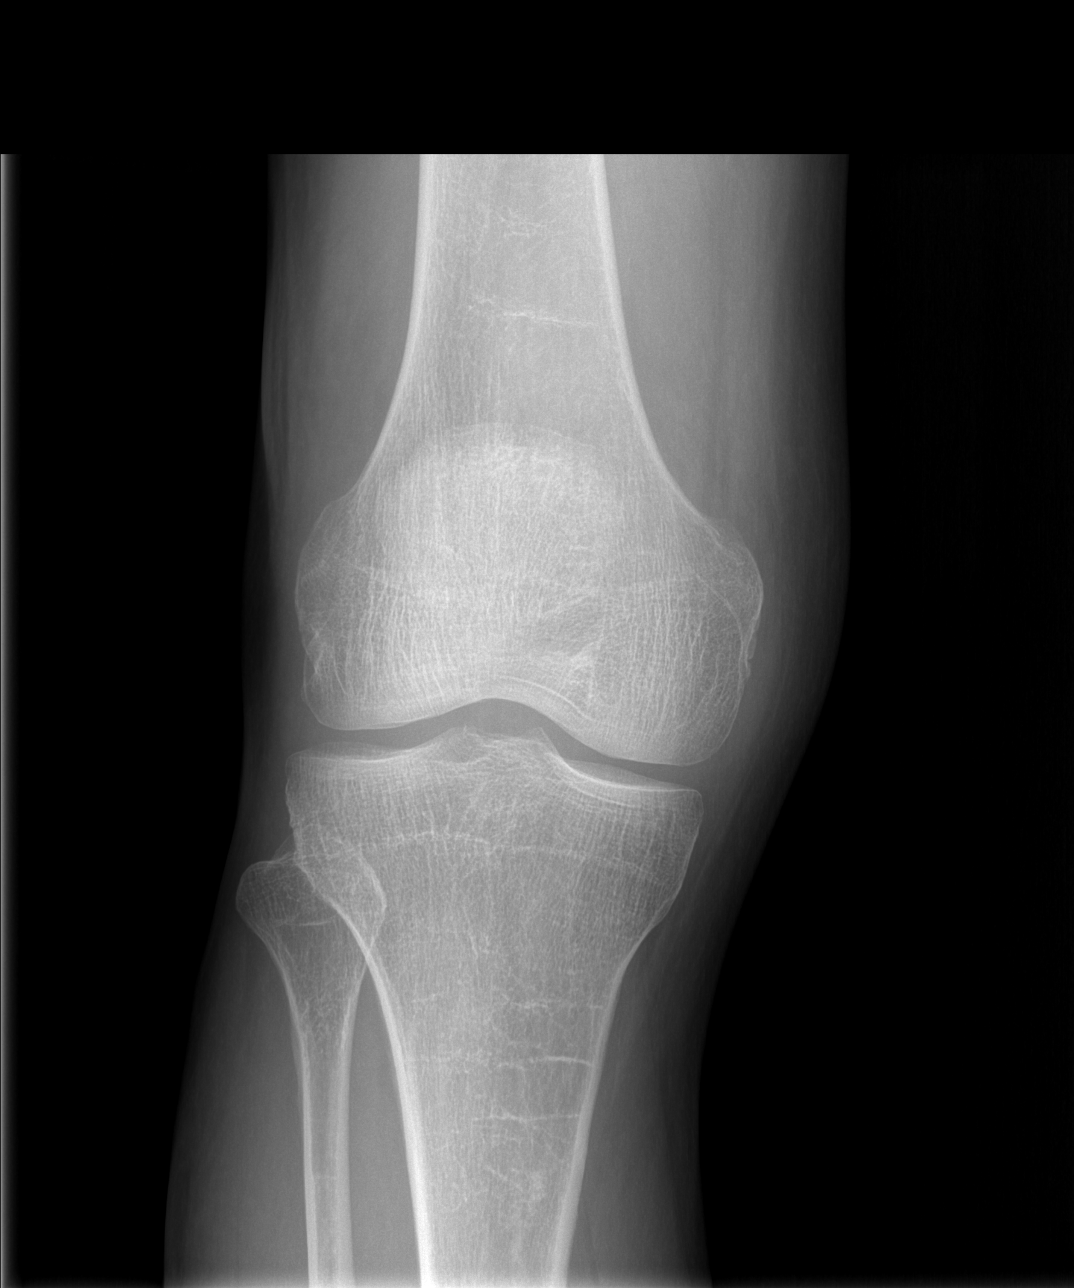

[t knee oblique right (1 of 2)]
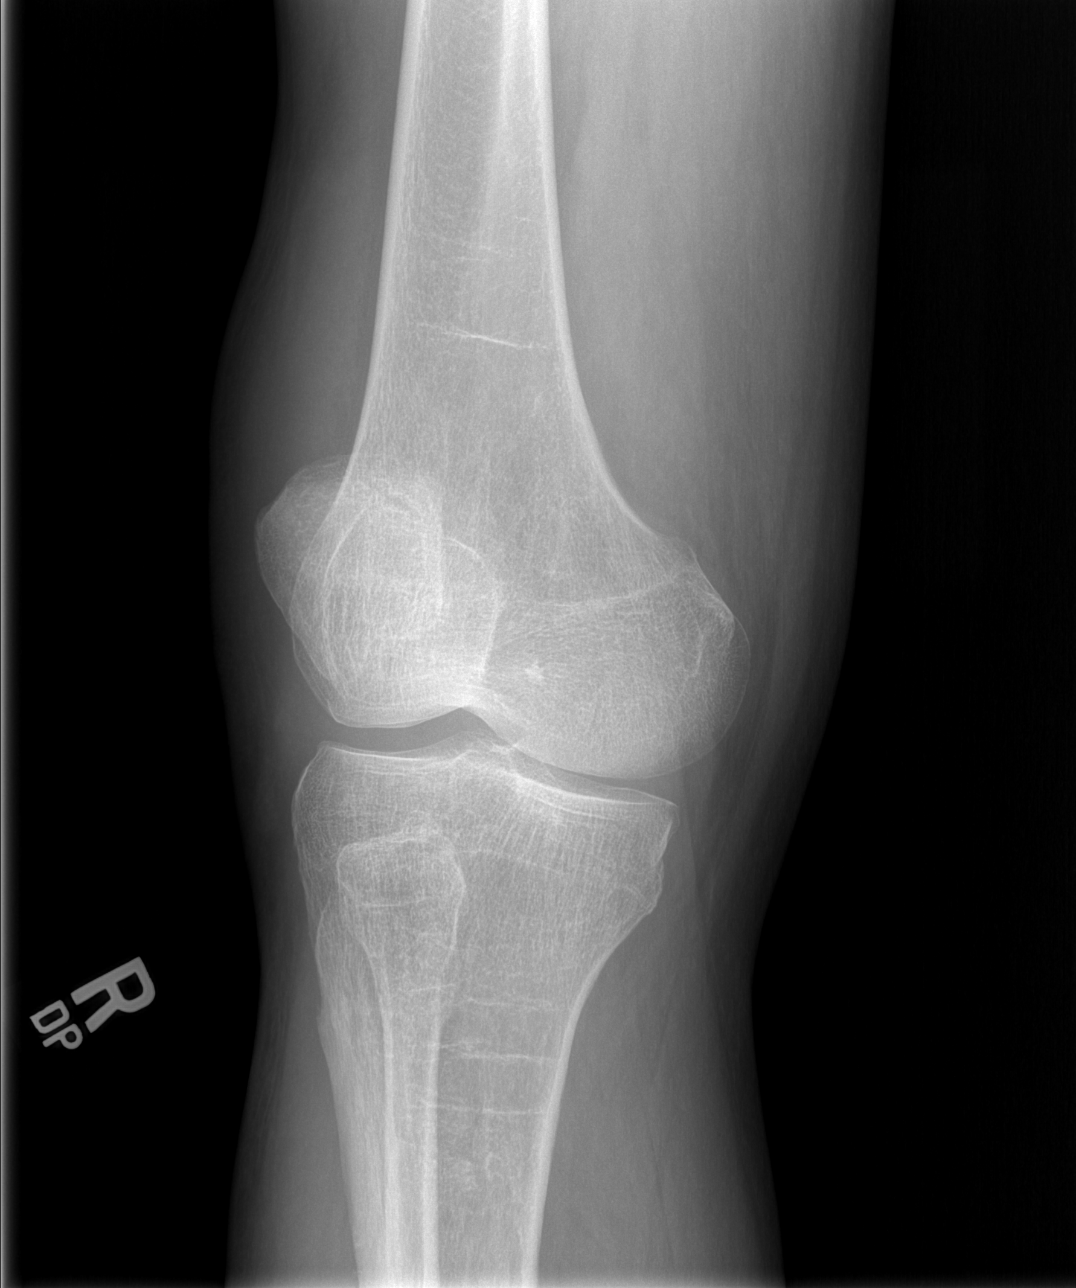

[t knee oblique right (2 of 2)]
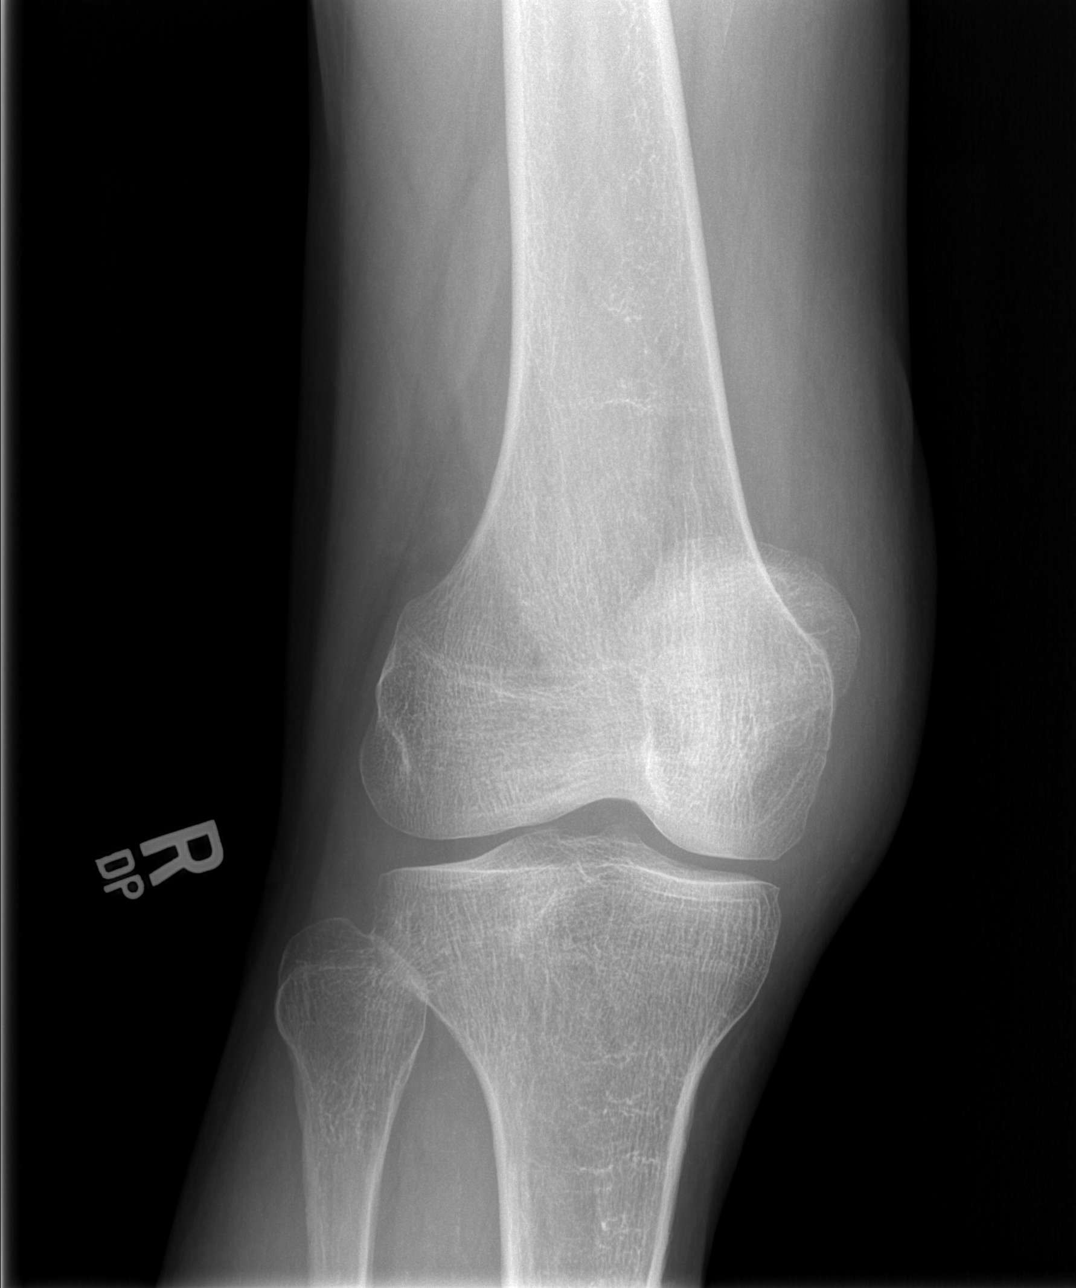

[t knee lat right]
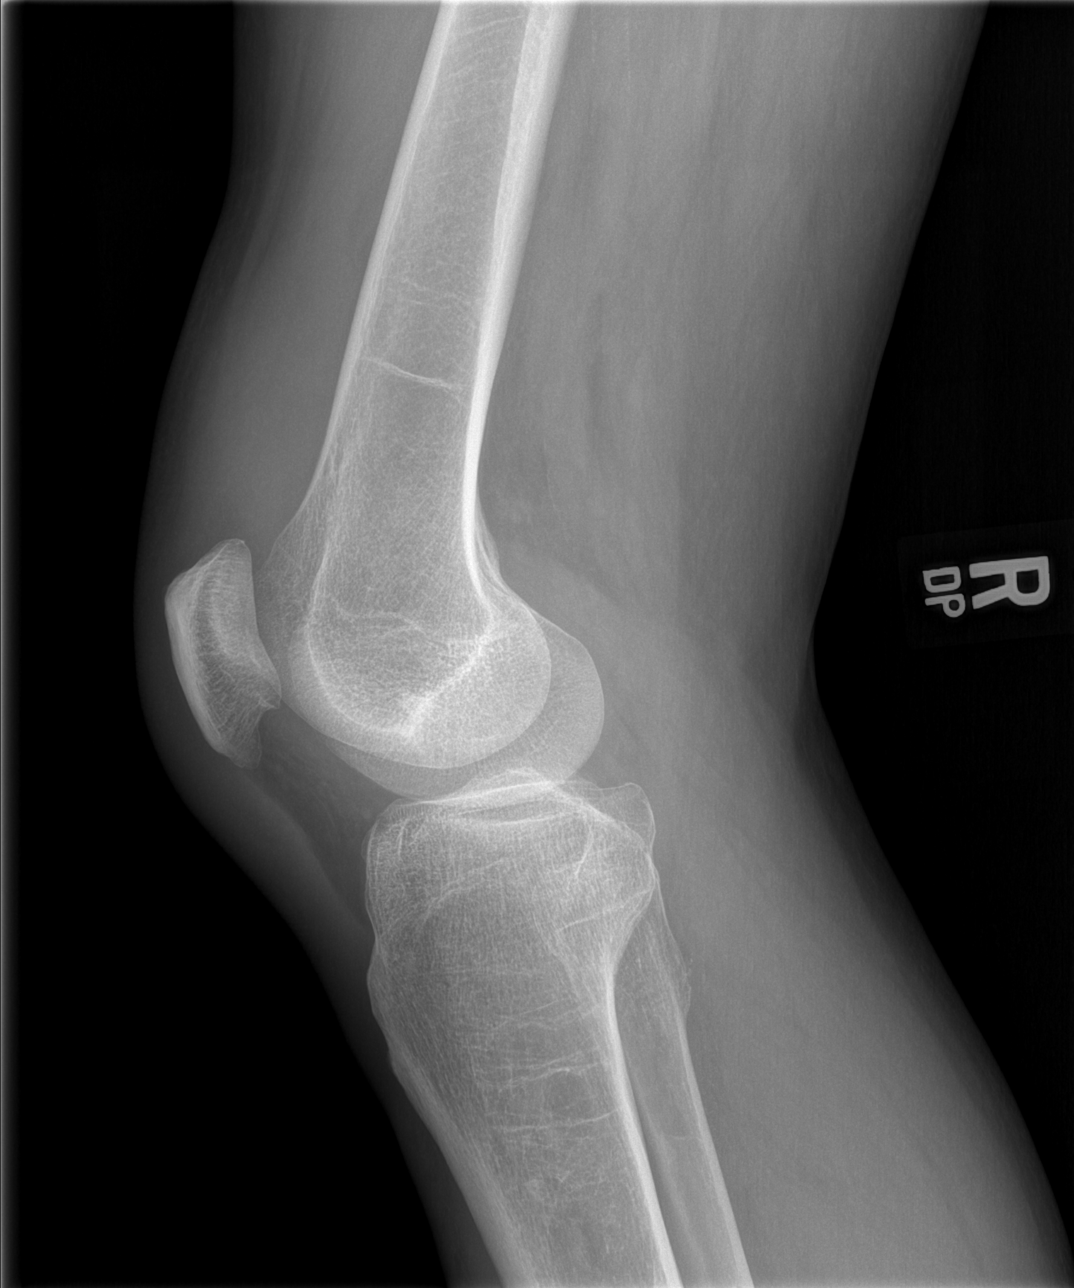

[4 of 4 positions shown; findings below may reference images not displayed]

FINDINGS: Findings consistent with large knee joint effusion. Thickening of
patellar tendon noted. Patellar tendinitis could present this
fashion no evidence fracture or dislocation. Lucency with sclerotic
margins noted in the patella seen only on one view is most likely
prominent vascular channel. Diffuse osteopenia present.
IMPRESSION: 1. Large knee joint effusion.
2. Prominence of the patellar tendon, patellar tendinitis could
present in this fashion.
3. Diffuse osteopenia and mild degenerative change. No acute bony
abnormality identified.

## 2015-01-30 DIAGNOSIS — H2511 Age-related nuclear cataract, right eye: Secondary | ICD-10-CM | POA: Diagnosis not present

## 2015-01-30 DIAGNOSIS — H02839 Dermatochalasis of unspecified eye, unspecified eyelid: Secondary | ICD-10-CM | POA: Diagnosis not present

## 2015-01-30 DIAGNOSIS — I10 Essential (primary) hypertension: Secondary | ICD-10-CM | POA: Diagnosis not present

## 2015-01-30 DIAGNOSIS — H2512 Age-related nuclear cataract, left eye: Secondary | ICD-10-CM | POA: Diagnosis not present

## 2015-03-28 ENCOUNTER — Other Ambulatory Visit: Payer: Medicare Other

## 2015-03-28 ENCOUNTER — Other Ambulatory Visit (INDEPENDENT_AMBULATORY_CARE_PROVIDER_SITE_OTHER): Payer: Medicare Other

## 2015-03-28 DIAGNOSIS — Z23 Encounter for immunization: Secondary | ICD-10-CM

## 2015-04-02 DIAGNOSIS — H2511 Age-related nuclear cataract, right eye: Secondary | ICD-10-CM | POA: Diagnosis not present

## 2015-04-02 DIAGNOSIS — H25811 Combined forms of age-related cataract, right eye: Secondary | ICD-10-CM | POA: Diagnosis not present

## 2015-04-03 DIAGNOSIS — H2512 Age-related nuclear cataract, left eye: Secondary | ICD-10-CM | POA: Diagnosis not present

## 2015-04-03 DIAGNOSIS — H25012 Cortical age-related cataract, left eye: Secondary | ICD-10-CM | POA: Diagnosis not present

## 2015-04-03 DIAGNOSIS — H25042 Posterior subcapsular polar age-related cataract, left eye: Secondary | ICD-10-CM | POA: Diagnosis not present

## 2015-04-16 DIAGNOSIS — H25812 Combined forms of age-related cataract, left eye: Secondary | ICD-10-CM | POA: Diagnosis not present

## 2015-04-16 DIAGNOSIS — H2512 Age-related nuclear cataract, left eye: Secondary | ICD-10-CM | POA: Diagnosis not present

## 2015-07-05 DIAGNOSIS — N402 Nodular prostate without lower urinary tract symptoms: Secondary | ICD-10-CM | POA: Diagnosis not present

## 2015-07-17 DIAGNOSIS — N402 Nodular prostate without lower urinary tract symptoms: Secondary | ICD-10-CM | POA: Diagnosis not present

## 2015-11-29 ENCOUNTER — Other Ambulatory Visit: Payer: Self-pay

## 2015-11-29 ENCOUNTER — Telehealth: Payer: Self-pay

## 2015-11-29 MED ORDER — LISINOPRIL-HYDROCHLOROTHIAZIDE 20-12.5 MG PO TABS: 1.0000 | ORAL_TABLET | Freq: Every day | ORAL | Status: DC

## 2015-11-29 NOTE — Telephone Encounter (Signed)
Pt needs 90 day supply of Lisinopril- HCTZ 20-12.5mg  requested via fax. Pt has CPE scheduled 12/13/2015. [name]

## 2015-11-29 NOTE — Telephone Encounter (Signed)
done

## 2015-12-13 ENCOUNTER — Encounter: Payer: Self-pay | Admitting: Family Medicine

## 2015-12-13 ENCOUNTER — Ambulatory Visit (INDEPENDENT_AMBULATORY_CARE_PROVIDER_SITE_OTHER): Payer: Medicare Other | Admitting: Family Medicine

## 2015-12-13 VITALS — BP 130/78 | HR 60 | Ht 69.0 in | Wt 174.6 lb

## 2015-12-13 DIAGNOSIS — I10 Essential (primary) hypertension: Secondary | ICD-10-CM | POA: Diagnosis not present

## 2015-12-13 DIAGNOSIS — Z79899 Other long term (current) drug therapy: Secondary | ICD-10-CM | POA: Diagnosis not present

## 2015-12-13 DIAGNOSIS — E785 Hyperlipidemia, unspecified: Secondary | ICD-10-CM

## 2015-12-13 DIAGNOSIS — M4120 Other idiopathic scoliosis, site unspecified: Secondary | ICD-10-CM | POA: Diagnosis not present

## 2015-12-13 DIAGNOSIS — Z1159 Encounter for screening for other viral diseases: Secondary | ICD-10-CM

## 2015-12-13 LAB — COMPREHENSIVE METABOLIC PANEL
ALT: 28 U/L (ref 9–46)
AST: 22 U/L (ref 10–35)
Albumin: 4.3 g/dL (ref 3.6–5.1)
Alkaline Phosphatase: 43 U/L (ref 40–115)
BILIRUBIN TOTAL: 0.8 mg/dL (ref 0.2–1.2)
BUN: 15 mg/dL (ref 7–25)
CALCIUM: 9.2 mg/dL (ref 8.6–10.3)
CO2: 28 mmol/L (ref 20–31)
Chloride: 101 mmol/L (ref 98–110)
Creat: 0.65 mg/dL — ABNORMAL LOW (ref 0.70–1.25)
Glucose, Bld: 102 mg/dL — ABNORMAL HIGH (ref 65–99)
Potassium: 4.2 mmol/L (ref 3.5–5.3)
Sodium: 138 mmol/L (ref 135–146)
Total Protein: 7.1 g/dL (ref 6.1–8.1)

## 2015-12-13 LAB — CBC WITH DIFFERENTIAL/PLATELET
BASOS PCT: 1 %
Basophils Absolute: 40 cells/uL (ref 0–200)
EOS ABS: 160 {cells}/uL (ref 15–500)
Eosinophils Relative: 4 %
HCT: 44.1 % (ref 38.5–50.0)
Hemoglobin: 14.9 g/dL (ref 13.2–17.1)
LYMPHS ABS: 1800 {cells}/uL (ref 850–3900)
Lymphocytes Relative: 45 %
MCH: 29.9 pg (ref 27.0–33.0)
MCHC: 33.8 g/dL (ref 32.0–36.0)
MCV: 88.6 fL (ref 80.0–100.0)
MONO ABS: 400 {cells}/uL (ref 200–950)
MPV: 9.2 fL (ref 7.5–12.5)
Monocytes Relative: 10 %
Neutro Abs: 1600 cells/uL (ref 1500–7800)
Neutrophils Relative %: 40 %
PLATELETS: 228 10*3/uL (ref 140–400)
RBC: 4.98 MIL/uL (ref 4.20–5.80)
RDW: 13.3 % (ref 11.0–15.0)
WBC: 4 10*3/uL (ref 4.0–10.5)

## 2015-12-13 LAB — LIPID PANEL
CHOLESTEROL: 208 mg/dL — AB (ref 125–200)
HDL: 53 mg/dL (ref >=40)
LDL Cholesterol: 134 mg/dL — ABNORMAL HIGH (ref <130)
TRIGLYCERIDES: 106 mg/dL (ref <150)
Total CHOL/HDL Ratio: 3.9 Ratio (ref <=5.0)
VLDL: 21 mg/dL (ref <30)

## 2015-12-13 MED ORDER — LISINOPRIL-HYDROCHLOROTHIAZIDE 20-12.5 MG PO TABS: 1.0000 | ORAL_TABLET | Freq: Every day | ORAL | Status: DC

## 2015-12-13 NOTE — Progress Notes (Signed)
Subjective:   HPI  DRAELYN SERVICE Sr. is a 70 y.o. male who presents for a complete physical.  Medical care team includes:  [facility].  [facility]  [facility] Drone.   Preventative care: Last ophthalmology visit: Sept and Oct 2016 Last dental visit: April 2017 Last colonoscopy: 07/14/2008 Last prostate exam: 07/2015 Last EKG:05/29/2014 Last labs:12/12/14  Prior vaccinations: TD or Tdap:2016 Influenza:2016 Pneumococcal:23/2009 13/2015 Shingles/Zostavax:2009 Other:   Advanced directive:Information given Concerns: None. He continues on his blood pressure medication as well as multiple OTC supplements. He takes his blood pressure medication and is doing well on this. His marriage remains strong. He and his wife take care of their daughter who has Angelman syndrome   Reviewed their medical, surgical, family, social, medication, and allergy history and updated chart as appropriate.    Review of Systems Constitutional: -fever, -chills, -sweats, -unexpected weight change, -decreased appetite, -fatigue Allergy: -sneezing, -itching, -congestion Dermatology: -changing moles, --rash, -lumps ENT: -runny nose, -ear pain, -sore throat, -hoarseness, -sinus pain, -teeth pain, - ringing in ears, -hearing loss, -nosebleeds Cardiology: -chest pain, -palpitations, -swelling, -difficulty breathing when lying flat, -waking up short of breath Respiratory: -cough, -shortness of breath, -difficulty breathing with exercise or exertion, -wheezing, -coughing up blood Gastroenterology: -abdominal pain, -nausea, -vomiting, -diarrhea, -constipation, -blood in stool, -changes in bowel movement, -difficulty swallowing or eating Hematology: -bleeding, -bruising  Musculoskeletal: -joint aches, -muscle aches, -joint swelling, -back pain, -neck pain, -cramping, -changes in gait Ophthalmology: denies vision changes, eye redness, itching, discharge  Neurology: -headache, -weakness, -tingling, -numbness,  -memory loss, -falls, -dizziness Psychology: -depressed mood, -agitation, -sleep problems     Objective:   Physical Exam General appearance: alert, no distress, WD/WN,  Skin:Normal HEENT: normocephalic, conjunctiva/corneas normal, sclerae anicteric, PERRLA, EOMi, nares patent, no discharge or erythema, pharynx normal Oral cavity: MMM, tongue normal, teeth normal Neck: supple, no lymphadenopathy, no thyromegaly, no masses, normal ROM Chest: non tender, normal shape and expansion Heart: RRR, normal S1, S2, no murmurs Lungs: CTA bilaterally, no wheezes, rhonchi, or rales Abdomen: +bs, soft, non tender, non distended, no masses, no hepatomegaly, no splenomegaly, no bruits Back: non tender, normal ROM,  scoliosis present Musculoskeletal: upper extremities non tender, no obvious deformity, normal ROM throughout, lower extremities non tender, no obvious deformity, normal ROM throughout Extremities: no edema, no cyanosis, no clubbing Pulses: 2+ symmetric, upper and lower extremities, normal cap refill Neurological: alert, oriented x 3, CN2-12 intact, strength normal upper extremities and lower extremities, sensation normal throughout, DTRs 2+ throughout, no cerebellar signs, gait normal Psychiatric: normal affect, behavior normal, pleasant    Assessment and Plan :   Essential hypertension - Plan: CBC with Differential/Platelet, Comprehensive metabolic panel, lisinopril-hydrochlorothiazide (PRINZIDE,ZESTORETIC) 20-12.5 MG tablet  Idiopathic scoliosis  Hyperlipidemia LDL goal <100 - Plan: Lipid panel  Encounter for long-term (current) use of medications  Need for hepatitis C screening test - Plan: Hepatitis C antibody  Physical exam - discussed healthy lifestyle, diet, exercise, preventative care, vaccinations, and addressed their concerns.   Overall things are going well for him. He and his wife are taking excellent care of their daughter. He has slowed down on his workload Follow-up  yearly

## 2015-12-14 LAB — HEPATITIS C ANTIBODY: HCV AB: NEGATIVE

## 2016-05-01 ENCOUNTER — Other Ambulatory Visit (INDEPENDENT_AMBULATORY_CARE_PROVIDER_SITE_OTHER): Payer: Medicare Other

## 2016-05-01 DIAGNOSIS — Z23 Encounter for immunization: Secondary | ICD-10-CM

## 2016-07-21 DIAGNOSIS — Z125 Encounter for screening for malignant neoplasm of prostate: Secondary | ICD-10-CM | POA: Diagnosis not present

## 2016-07-28 DIAGNOSIS — Z125 Encounter for screening for malignant neoplasm of prostate: Secondary | ICD-10-CM | POA: Diagnosis not present

## 2016-08-25 ENCOUNTER — Telehealth: Payer: Self-pay | Admitting: Internal Medicine

## 2016-08-25 NOTE — Telephone Encounter (Signed)
Patient is calling to see if he needs to have blood work, completed prior to appointment on 09-12-16. Please call, thanks.

## 2016-08-27 NOTE — Telephone Encounter (Signed)
Called patient and advised to come to his appointment with Dr. Harrington Challenger and if any blood work is needed we will get it the day of his visit.  He thanked me for the call.

## 2016-09-03 ENCOUNTER — Encounter: Payer: Self-pay | Admitting: Internal Medicine

## 2016-09-11 NOTE — Progress Notes (Signed)
Cardiology Office Note   Date:  09/12/2016   ID:  [name], DOB [birth year], MRN [id number]  PCP:  Wyatt Haste, MD  Cardiologist:   [name], MD    F/U of HTN     History of Present Illness: Samuel SOLLARS Sr. is a 71 y.o. male with a history of HTN and HL  Problems with lipitor in past    I saw him in NOv 2015  BP was a little high then    Since seen he says he has done good  No CP Breathing is OK   BP at other visitis 130   Current Meds  Medication Sig  . Ascorbic Acid (VITAMIN C) 500 MG tablet Take 500 mg by mouth daily. 2 tabs daily   . aspirin 81 MG tablet Take 81 mg by mouth daily.    Marland Kitchen co-enzyme Q-10 30 MG capsule Take 50 mg by mouth daily.   Marland Kitchen lisinopril-hydrochlorothiazide (PRINZIDE,ZESTORETIC) 20-12.5 MG tablet Take 1 tablet by mouth daily.  . Multiple Vitamin (MULTIVITAMIN) capsule Take 1 capsule by mouth daily.    . Omega-3 Fatty Acids (FISH OIL PO) Take 2 tablets by mouth daily.    Marland Kitchen PROBIOTIC CAPS Take by mouth daily.    . Psyllium (METAMUCIL PO) Take 19 g by mouth. As directed  . Red Yeast Rice POWD 600 mg 2 tabs two times per day     Allergies:   Patient has no known allergies.   Past Medical History:  Diagnosis Date  . Dyslipidemia   . HTN (hypertension)   . Scoliosis     Past Surgical History:  Procedure Laterality Date  . COLONOSCOPY  2010   [name]  . HERNIA REPAIR    . HIP SURGERY    . right inguinal hernia    . scoliosis     x2  . TONSILLECTOMY       Social History:  The patient  reports that he has never smoked. He has never used smokeless tobacco. He reports that he does not drink alcohol or use drugs.   Family History:  [name] family history includes Cancer in his brother, father, and sister; Coronary artery disease in his brother; Heart attack in his mother; Heart failure in his mother.    ROS:  Please see the history of present illness. All other systems are reviewed and  Negative to the above problem  except as noted.    PHYSICAL EXAM: VS:  BP 138/78   Pulse (!) 57   Ht 5\' 9"  (1.753 m)   Wt 175 lb (79.4 kg)   BMI 25.84 kg/m   GEN: Well nourished, well developed, in no acute distress  HEENT: normal  Neck: no JVD, carotid bruits, or masses Cardiac: RRR; no murmurs, rubs, or gallops,no edema  Respiratory:  clear to auscultation bilaterally, normal work of breathing GI: soft, nontender, nondistended, + BS  No hepatomegaly  MS: no deformity Moving all extremities   Skin: warm and dry, no rash Neuro:  Strength and sensation are intact Psych: euthymic mood, full affect   EKG:  EKG is ordered today.  SB 57 bpm  RBBB   Lipid Panel    Component Value Date/Time   CHOL 208 (H) 12/13/2015 0812   TRIG 106 12/13/2015 0812   HDL 53 12/13/2015 0812   CHOLHDL 3.9 12/13/2015 0812   VLDL 21 12/13/2015 0812   LDLCALC 134 (H) 12/13/2015 0812      Wt Readings from  Last 3 Encounters:  09/12/16 175 lb (79.4 kg)  12/13/15 174 lb 9.6 oz (79.2 kg)  12/12/14 170 lb 12.8 oz (77.5 kg)      ASSESSMENT AND PLAN:  1  HTN  BP is fari control  Continue meds  WIll get labs in June    2  HL  Pt just had chicken sandwich  Discussed saturated fats  Cut back  Pt will have labs in June through yearly physcial   I told her to have them forwarded for review  F/U in 1 year     Current medicines are reviewed at length with the patient today.  The patient does not have concerns regarding medicines.  Signed, [name], MD  09/12/2016 11:06 AM    [address], [place], [place]  [address] Phone: [phone]; Fax: [phone]

## 2016-09-12 ENCOUNTER — Ambulatory Visit (INDEPENDENT_AMBULATORY_CARE_PROVIDER_SITE_OTHER): Payer: Medicare Other | Admitting: Internal Medicine

## 2016-09-12 ENCOUNTER — Encounter: Payer: Self-pay | Admitting: Internal Medicine

## 2016-09-12 VITALS — BP 138/78 | HR 57 | Ht 69.0 in | Wt 175.0 lb

## 2016-09-12 DIAGNOSIS — I10 Essential (primary) hypertension: Secondary | ICD-10-CM | POA: Diagnosis not present

## 2016-09-12 NOTE — Patient Instructions (Addendum)
Your physician recommends that you continue on your current medications as directed. Please refer to the Current Medication list given to you today. Your physician wants you to follow-up in: 1 year with Dr. Ross.  You will receive a reminder letter in the mail two months in advance. If you don't receive a letter, please call our office to schedule the follow-up appointment.  

## 2016-11-21 DIAGNOSIS — H52223 Regular astigmatism, bilateral: Secondary | ICD-10-CM | POA: Diagnosis not present

## 2016-11-21 DIAGNOSIS — H11153 Pinguecula, bilateral: Secondary | ICD-10-CM | POA: Diagnosis not present

## 2016-11-21 DIAGNOSIS — H18413 Arcus senilis, bilateral: Secondary | ICD-10-CM | POA: Diagnosis not present

## 2016-11-21 DIAGNOSIS — H5212 Myopia, left eye: Secondary | ICD-10-CM | POA: Diagnosis not present

## 2016-12-16 ENCOUNTER — Encounter: Payer: Self-pay | Admitting: Family Medicine

## 2016-12-16 ENCOUNTER — Ambulatory Visit (INDEPENDENT_AMBULATORY_CARE_PROVIDER_SITE_OTHER): Payer: Medicare Other | Admitting: Family Medicine

## 2016-12-16 VITALS — BP 124/82 | HR 60 | Ht 69.0 in | Wt 172.0 lb

## 2016-12-16 DIAGNOSIS — I10 Essential (primary) hypertension: Secondary | ICD-10-CM

## 2016-12-16 DIAGNOSIS — Z79899 Other long term (current) drug therapy: Secondary | ICD-10-CM

## 2016-12-16 DIAGNOSIS — M4120 Other idiopathic scoliosis, site unspecified: Secondary | ICD-10-CM

## 2016-12-16 DIAGNOSIS — Z0000 Encounter for general adult medical examination without abnormal findings: Secondary | ICD-10-CM

## 2016-12-16 DIAGNOSIS — E785 Hyperlipidemia, unspecified: Secondary | ICD-10-CM | POA: Diagnosis not present

## 2016-12-16 LAB — CBC WITH DIFFERENTIAL/PLATELET
BASOS ABS: 44 {cells}/uL (ref 0–200)
BASOS PCT: 1 %
EOS ABS: 176 {cells}/uL (ref 15–500)
Eosinophils Relative: 4 %
HCT: 44.5 % (ref 38.5–50.0)
HEMOGLOBIN: 15 g/dL (ref 13.2–17.1)
LYMPHS ABS: 1584 {cells}/uL (ref 850–3900)
Lymphocytes Relative: 36 %
MCH: 30.1 pg (ref 27.0–33.0)
MCHC: 33.7 g/dL (ref 32.0–36.0)
MCV: 89.4 fL (ref 80.0–100.0)
MONO ABS: 484 {cells}/uL (ref 200–950)
MONOS PCT: 11 %
MPV: 9.5 fL (ref 7.5–12.5)
NEUTROS ABS: 2112 {cells}/uL (ref 1500–7800)
Neutrophils Relative %: 48 %
PLATELETS: 263 10*3/uL (ref 140–400)
RBC: 4.98 MIL/uL (ref 4.20–5.80)
RDW: 13 % (ref 11.0–15.0)
WBC: 4.4 10*3/uL (ref 4.0–10.5)

## 2016-12-16 MED ORDER — LISINOPRIL-HYDROCHLOROTHIAZIDE 20-12.5 MG PO TABS: 1.0000 | ORAL_TABLET | Freq: Every day | ORAL | 3 refills | Status: DC

## 2016-12-16 NOTE — Progress Notes (Signed)
Subjective:     Patient ID: [name], male   DOB: Jun 25, 1946, 71 y.o.   MRN: [id number]  HPI Izick Gasbarro is a 71 year old male with a past medical history of hyperlipidemia, hypertension, and scoliosis who presents today for a medicare wellness visit and complete physical exam.   He does have concerns about his hearing. He has noticed a gradual decrease in his hearing over time. His family members have not said anything to him about his hearing loss. He is wondering if he should get this evaluated.   He has a history of hyperlipidemia, but is not currently on any medications for this.  He has a history of hypertension and is currently taking lisinopril-HCTZ. He is not having any dizziness, lightheadedness, or headaches.   He has a history of scoliosis, but is not having any pain or difficulty walking or breathing.  He takes all of his medications as prescribed including lisinopril-HCTZ for blood pressure. He takes an aspirin daily as well as vitamin C, co-enzyme q, multi vitamin, omega 3 fatty acids, probiotic, metamucil, and red yeast rice. He isn't having any side effects from these medications and doesn't have any concerns about them.  His diet is normal and unchanged. He tries to eat fruits and vegetables. He exercises by walking on a treadmill every day for 25 minutes. He does not use tobacco products, drink alcohol, or use any recreational drugs. He feels safe and supported at home. He does not currently have an advance directive.   His last opthalmology exam was in May 2018 His last dental exam was in March 2018 His last labs were in June 2017.  He is currently being followed by Dr. Harrington Challenger, his cardiologist, as well as Dr. Louis Meckel, his urologist.  Review of Systems Pertinent positives and negatives included in the subjective above.    Objective:   Physical Exam Alert and in no distress. Ear canals were normal and non-erythematous. Tympanic membranes were clear and without  bulging Weber and Rinne testing were normal bilaterally. Patient could not hear anything when fingers were rubbed next to both ears bilaterally. Nasal mucosa was normal Throat was non-erythematous and without exudate. Neck was without lymphadenopathy and without tenderness. Heart was regular rate and rhythm without murmurs rubs or gallops. Lungs were clear to auscultation bilaterally Patella reflexes were normal bilaterally. Back showed scoliosis most severe in the thoracic region and consistent with his previous history   Patient's hearing exam showed decreased hearing at higher decibels with the left ear having trouble hearing 40-60 Db and the right ear having trouble hearing 60 Db.    Assessment and Plan:     Routine general medical examination at a health care facility - Plan: CBC with Differential/Platelet, Comprehensive metabolic panel, Lipid panel  Other idiopathic scoliosis, unspecified spinal region  Hyperlipidemia LDL goal <100 - Plan: Lipid panel  Essential hypertension - Plan: CBC with Differential/Platelet, Comprehensive metabolic panel, lisinopril-hydrochlorothiazide (PRINZIDE,ZESTORETIC) 20-12.5 MG tablet  Encounter for long-term (current) use of medications  1. Immunizations/Screenings: We discussed the new shingles vaccine and encouraged him to get the vaccine at his pharmacy. He is up to date on all of his other vaccinations and screenings including colonoscopy, influenza vaccine, pneumonia vaccines, and Tdap. 2. Scoliosis: Patient has not had any issues or pain related to his scoliosis. He is not concerned about this.  3. Hyperlipidemia: Patient's last lipid panel was last year. We will obtain a lipid panel today to reevaluate this.  4. Essential  Hypertension: Patient is well-controlled on current regimen with a blood pressure of 124/82. We encouraged him to continue to take his lisinopril-HCTZ as prescribed. 5. Hearing: Patient was concerned about hearing, but the  Weber and Rhine testing were normal and the hearing test was also not concerning. We reassured him that nothing needs to be done about this right now and encouraged him to let us know if there are any changes in his hearing, if it gets worse, and if he would like to be evaluated for hearing aids in the future. 6. Advance Directive: He does not currently have an advance directive. Information was given to him about this. Discussed the fact that at this point there is really no need for him necessarily to see cardiology and urology since both of them are stable. Patient seen and evaluated by me in conjunction with the medical student. Patient was seen in conjunction with Dr. Redmond School. Arlington Calix, MS3

## 2016-12-17 LAB — COMPREHENSIVE METABOLIC PANEL
ALBUMIN: 4.3 g/dL (ref 3.6–5.1)
ALK PHOS: 39 U/L — AB (ref 40–115)
ALT: 26 U/L (ref 9–46)
AST: 24 U/L (ref 10–35)
BUN: 19 mg/dL (ref 7–25)
CHLORIDE: 101 mmol/L (ref 98–110)
CO2: 27 mmol/L (ref 20–31)
CREATININE: 0.72 mg/dL (ref 0.70–1.18)
Calcium: 9.7 mg/dL (ref 8.6–10.3)
Glucose, Bld: 104 mg/dL — ABNORMAL HIGH (ref 65–99)
POTASSIUM: 4.6 mmol/L (ref 3.5–5.3)
Sodium: 139 mmol/L (ref 135–146)
TOTAL PROTEIN: 7.2 g/dL (ref 6.1–8.1)
Total Bilirubin: 0.7 mg/dL (ref 0.2–1.2)

## 2016-12-17 LAB — LIPID PANEL
Cholesterol: 184 mg/dL (ref <200)
HDL: 56 mg/dL (ref >40)
LDL CALC: 113 mg/dL — AB (ref <100)
TRIGLYCERIDES: 74 mg/dL (ref <150)
Total CHOL/HDL Ratio: 3.3 Ratio (ref <5.0)
VLDL: 15 mg/dL (ref <30)

## 2017-05-05 ENCOUNTER — Other Ambulatory Visit (INDEPENDENT_AMBULATORY_CARE_PROVIDER_SITE_OTHER): Payer: Medicare Other

## 2017-05-05 DIAGNOSIS — Z23 Encounter for immunization: Secondary | ICD-10-CM | POA: Diagnosis not present

## 2017-07-21 DIAGNOSIS — Z125 Encounter for screening for malignant neoplasm of prostate: Secondary | ICD-10-CM | POA: Diagnosis not present

## 2017-07-28 DIAGNOSIS — Z125 Encounter for screening for malignant neoplasm of prostate: Secondary | ICD-10-CM | POA: Diagnosis not present

## 2017-07-28 DIAGNOSIS — N402 Nodular prostate without lower urinary tract symptoms: Secondary | ICD-10-CM | POA: Diagnosis not present

## 2017-09-14 ENCOUNTER — Encounter: Payer: Self-pay | Admitting: Internal Medicine

## 2017-09-14 ENCOUNTER — Ambulatory Visit: Payer: Medicare Other | Admitting: Internal Medicine

## 2017-09-14 VITALS — BP 142/88 | HR 55 | Ht 69.0 in | Wt 176.4 lb

## 2017-09-14 DIAGNOSIS — I10 Essential (primary) hypertension: Secondary | ICD-10-CM | POA: Diagnosis not present

## 2017-09-14 DIAGNOSIS — E785 Hyperlipidemia, unspecified: Secondary | ICD-10-CM | POA: Diagnosis not present

## 2017-09-14 LAB — LIPID PANEL
Chol/HDL Ratio: 3.6 ratio (ref 0.0–5.0)
Cholesterol, Total: 193 mg/dL (ref 100–199)
HDL: 53 mg/dL (ref >39)
LDL Calculated: 125 mg/dL — ABNORMAL HIGH (ref 0–99)
Triglycerides: 77 mg/dL (ref 0–149)
VLDL Cholesterol Cal: 15 mg/dL (ref 5–40)

## 2017-09-14 NOTE — Progress Notes (Signed)
Cardiology Office Note   Date:  09/14/2017   ID:  Samuel Police Sr., DOB 14-Sep-1945, MRN [id number]  PCP:  [name], MD  Cardiologist:   [name], MD    F/U of HTN     History of Present Illness: Samuel Spirito. is a 72 y.o. male with a history of HTN and HL  Problems with lipitor in past    I saw him in March 2018    Since seen he says he has done good  No CP Breathing is OK   BP at other visitis 130   Current Meds  Medication Sig  . Ascorbic Acid (VITAMIN C) 500 MG tablet Take 500 mg by mouth daily. 2 tabs daily   . aspirin 81 MG tablet Take 81 mg by mouth daily.    Marland Kitchen co-enzyme Q-10 30 MG capsule Take 50 mg by mouth daily.   Marland Kitchen lisinopril-hydrochlorothiazide (PRINZIDE,ZESTORETIC) 20-12.5 MG tablet Take 1 tablet by mouth daily.  . Multiple Vitamin (MULTIVITAMIN) capsule Take 1 capsule by mouth daily.    . Omega-3 Fatty Acids (FISH OIL PO) Take 2 tablets by mouth daily.    Marland Kitchen PROBIOTIC CAPS Take by mouth daily.    . Psyllium (METAMUCIL PO) Take 19 g by mouth. As directed  . Red Yeast Rice POWD 600 mg 2 tabs two times per day     Allergies:   Patient has no known allergies.   Past Medical History:  Diagnosis Date  . Dyslipidemia   . HTN (hypertension)   . Scoliosis     Past Surgical History:  Procedure Laterality Date  . COLONOSCOPY  2010   [facility]  . HERNIA REPAIR    . HIP SURGERY    . right inguinal hernia    . scoliosis     x2  . TONSILLECTOMY       Social History:  The patient  reports that  has never smoked. he has never used smokeless tobacco. He reports that he does not drink alcohol or use drugs.   Family History:  [name] family history includes Cancer in his brother, father, and sister; Coronary artery disease in his brother; Heart attack in his mother; Heart failure in his mother.    ROS:  Please see the history of present illness. All other systems are reviewed and  Negative to the above problem except as noted.    PHYSICAL  EXAM: VS:  BP (!) 142/88   Pulse (!) 55   Ht 5\' 9"  (1.753 m)   Wt 176 lb 6.4 oz (80 kg)   BMI 26.05 kg/m    My check 152/88   GEN: Well nourished, well developed, in no acute distress  HEENT: normal  Neck: no JVD, carotid bruits, or masses Cardiac: RRR; no murmurs, rubs, or gallops,no edema  Respiratory:  clear to auscultation bilaterally, normal work of breathing GI: soft, nontender, nondistended, + BS  No hepatomegaly  MS: no deformity Moving all extremities   Skin: warm and dry, no rash Neuro:  Strength and sensation are intact Psych: euthymic mood, full affect   EKG:  EKG is ordered today.  SB 55 bpm  RBBB LVH     Lipid Panel    Component Value Date/Time   CHOL 184 12/16/2016 0854   TRIG 74 12/16/2016 0854   HDL 56 12/16/2016 0854   CHOLHDL 3.3 12/16/2016 0854   VLDL 15 12/16/2016 0854   LDLCALC 113 (H) 12/16/2016 4098  Wt Readings from Last 3 Encounters:  09/14/17 176 lb 6.4 oz (80 kg)  12/16/16 172 lb (78 kg)  09/12/16 175 lb (79.4 kg)      ASSESSMENT AND PLAN:  1  HTN  BP is high  I would recomm BP check in 1-2 months  Bring home cuff with him to compare  2  HL  LDL wa 113 on red yeast rice  Watch saturated fats  Cut back  Check lipids today  Never had heart testing, no CT scan  Stay acitve    F/U in 1 to 2 months for BP  Bring cuff  If OK f/u in 1 year    Current medicines are reviewed at length with the patient today.  The patient does not have concerns regarding medicines.  Signed, [name], MD  09/14/2017 9:33 AM    [name], [place], [place]  [address] Phone: [phone]; Fax: [phone]

## 2017-09-14 NOTE — Patient Instructions (Signed)
Your physician recommends that you continue on your current medications as directed. Please refer to the Current Medication list given to you today.  Your physician recommends that you return for lab work in: today ([place])  Your physician wants you to follow-up in: 1-2 months for nurse visit (BP check).  Bring your blood pressure cuff with you.  Your physician wants you to follow-up in: [place].  You will receive a reminder letter in the mail two months in advance. If you don't receive a letter, please call our office to schedule the follow-up appointment.

## 2017-10-28 ENCOUNTER — Ambulatory Visit: Payer: Medicare Other

## 2017-10-30 ENCOUNTER — Ambulatory Visit (INDEPENDENT_AMBULATORY_CARE_PROVIDER_SITE_OTHER): Payer: Medicare Other

## 2017-10-30 VITALS — BP 110/66 | HR 65 | Ht 70.0 in | Wt 175.0 lb

## 2017-10-30 DIAGNOSIS — I10 Essential (primary) hypertension: Secondary | ICD-10-CM

## 2017-10-30 NOTE — Patient Instructions (Signed)
Your physician recommends that you follow up with Dr. Harrington Challenger as advised at last office visit.

## 2017-10-30 NOTE — Progress Notes (Signed)
1.) Reason for visit: BP check  2.) Name of MD requesting visit: Dr. Harrington Challenger  3.) H&P: Patient has history of HTN and Hyperlipidemia.   4.) ROS related to problem: Patient's vital signs BP 110/66, HR 65, and O2 95% on room air.  Patient's weight 175 lbs and height 5\' 10" .  Patient denies any chest pain or SOB. Check BP with patient's home machine, patient's home machine showed 138/78. Check manual again BP 122/72. Informed patient that he might need a new BP machine. Patient verbalized understanding.   5.) Assessment and plan per MD: DOD, Dr. Lovena Le reviewed patient's BP and HR. No changes at this time. Follow up with Dr. Harrington Challenger as planned.

## 2017-11-17 DIAGNOSIS — H40013 Open angle with borderline findings, low risk, bilateral: Secondary | ICD-10-CM | POA: Diagnosis not present

## 2017-11-17 DIAGNOSIS — H3589 Other specified retinal disorders: Secondary | ICD-10-CM | POA: Diagnosis not present

## 2017-11-17 DIAGNOSIS — H26492 Other secondary cataract, left eye: Secondary | ICD-10-CM | POA: Diagnosis not present

## 2017-11-17 DIAGNOSIS — H26491 Other secondary cataract, right eye: Secondary | ICD-10-CM | POA: Diagnosis not present

## 2017-11-17 DIAGNOSIS — H04123 Dry eye syndrome of bilateral lacrimal glands: Secondary | ICD-10-CM | POA: Diagnosis not present

## 2017-12-17 ENCOUNTER — Ambulatory Visit (INDEPENDENT_AMBULATORY_CARE_PROVIDER_SITE_OTHER): Payer: Medicare Other | Admitting: Family Medicine

## 2017-12-17 ENCOUNTER — Encounter: Payer: Self-pay | Admitting: Family Medicine

## 2017-12-17 VITALS — BP 144/82 | HR 54 | Temp 97.4°F | Ht 69.0 in | Wt 175.0 lb

## 2017-12-17 DIAGNOSIS — M4120 Other idiopathic scoliosis, site unspecified: Secondary | ICD-10-CM | POA: Diagnosis not present

## 2017-12-17 DIAGNOSIS — Z79899 Other long term (current) drug therapy: Secondary | ICD-10-CM | POA: Diagnosis not present

## 2017-12-17 DIAGNOSIS — E785 Hyperlipidemia, unspecified: Secondary | ICD-10-CM

## 2017-12-17 DIAGNOSIS — R972 Elevated prostate specific antigen [PSA]: Secondary | ICD-10-CM

## 2017-12-17 DIAGNOSIS — I10 Essential (primary) hypertension: Secondary | ICD-10-CM | POA: Diagnosis not present

## 2017-12-17 DIAGNOSIS — Z0000 Encounter for general adult medical examination without abnormal findings: Secondary | ICD-10-CM | POA: Diagnosis not present

## 2017-12-17 NOTE — Progress Notes (Signed)
Samuel PIERPOINT Sr. is a 72 y.o. male who presents for annual wellness visit and follow-up on chronic medical conditions.  He has the following concerns: He does have a history of elevated PSA and has had biopsy.  He is being followed by urology for this.  He also has hypertension and was seen by cardiology in March.  He did bring his blood pressure cuff in for evaluation after that but was told to get a new cuff however he has not done that.  He does have a history of hyperlipidemia.  Otherwise he has no particular concerns or complaints.  He continues to work.  His marriage is going well he and his wife take care of their daughter who has Angelman syndrome.  Immunizations and Health Maintenance Immunization History  Administered Date(s) Administered  . Influenza Split 05/20/2011, 04/23/2012  . Influenza, High Dose Seasonal PF 04/21/2013, 05/11/2014, 03/28/2015, 05/01/2016, 05/05/2017  . Pneumococcal Conjugate-13 01/23/2014  . Pneumococcal Polysaccharide-23 07/21/2007  . Td 12/12/2014  . Tdap 12/15/2003  . Zoster 07/21/2007   Health Maintenance Due  Topic Date Due  . PNA vac Low Risk Adult (2 of 2 - PPSV23) 01/24/2015    Last colonoscopy: over five years ago Last PSA: jan 2019 Dentist: oct 2018 Ophtho: may 2019 Exercise: walking  Other doctors caring for patient include:Ross, Louis Meckel  Advanced Directives: Does Patient Have a Medical Advance Directive?: No Would patient like information on creating a medical advance directive?: Yes (MAU/Ambulatory/Procedural Areas - Information given)  Depression screen:  See questionnaire below.     Depression screen [facility] 2/9 12/17/2017 12/16/2016 12/13/2015 12/12/2014 12/12/2014  Decreased Interest 0 0 0 0 0  Down, Depressed, Hopeless 0 0 0 0 0  PHQ - 2 Score 0 0 0 0 0    Fall Screen: See Questionaire below.   Fall Risk  12/17/2017 12/16/2016 12/13/2015 12/12/2014 12/12/2014  Falls in the past year? No No No No No    ADL screen:  See questionnaire below.   Functional Status Survey: Is the patient deaf or have difficulty hearing?: No Does the patient have difficulty seeing, even when wearing glasses/contacts?: No Does the patient have difficulty concentrating, remembering, or making decisions?: No Does the patient have difficulty walking or climbing stairs?: No Does the patient have difficulty dressing or bathing?: No Does the patient have difficulty doing errands alone such as visiting a doctor's office or shopping?: No   Review of Systems  Constitutional: -, -unexpected weight change, -anorexia, -fatigue Allergy: -sneezing, -itching, -congestion Dermatology: denies changing moles, rash, lumps ENT: -runny nose, -ear pain, -sore throat,  Cardiology:  -chest pain, -palpitations, -orthopnea, Respiratory: -cough, -shortness of breath, -dyspnea on exertion, -wheezing,  Gastroenterology: -abdominal pain, -nausea, -vomiting, -diarrhea, -constipation, -dysphagia Hematology: -bleeding or bruising problems Musculoskeletal: -arthralgias, -myalgias, -joint swelling, -back pain, - Ophthalmology: -vision changes,  Urology: -dysuria, -difficulty urinating,  -urinary frequency, -urgency, incontinence Neurology: -, -numbness, , -memory loss, -falls, -dizziness    PHYSICAL EXAM:  BP (!) 144/82 (BP Location: Left Arm, Patient Position: Sitting)   Pulse (!) 54   Temp (!) 97.4 F (36.3 C)   Ht 5\' 9"  (1.753 m)   Wt 175 lb (79.4 kg)   SpO2 96%   BMI 25.84 kg/m   General Appearance: Alert, cooperative, no distress, appears stated age Head: Normocephalic, without obvious abnormality, atraumatic Eyes: PERRL, conjunctiva/corneas clear, EOM's intact, fundi benign Ears: Normal TM's and external ear canals Nose: Nares normal, mucosa normal, no drainage or sinus   tenderness Throat:  Lips, mucosa, and tongue normal; teeth and gums normal Neck: Supple, no lymphadenopathy, thyroid:no enlargement/tenderness/nodules; no carotid bruit or JVD Lungs: Clear to  auscultation bilaterally without wheezes, rales or ronchi; respirations unlabored Heart: Regular rate and rhythm, S1 and S2 normal, no murmur, rub or gallop Abdomen: Soft, non-tender, nondistended, normoactive bowel sounds, no masses, no hepatosplenomegaly Extremities: No clubbing, cyanosis or edema Pulses: 2+ and symmetric all extremities Skin: Skin color, texture, turgor normal, no rashes or lesions Lymph nodes: Cervical, supraclavicular, and axillary nodes normal Neurologic: CNII-XII intact, normal strength, sensation and gait; reflexes 2+ and symmetric throughout   Psych: Normal mood, affect, hygiene and grooming Scoliosis noted ASSESSMENT/PLAN: Routine general medical examination at a health care facility  Other idiopathic scoliosis, unspecified spinal region  Hyperlipidemia LDL goal <100  Essential hypertension  Encounter for long-term (current) use of medications  Elevated PSA  Discussed PSA screening (risks/benefits), Get a new blood pressure machine and start checking your pressure in the resting sitting position and bring those numbers in with with a blood pressure machine in about a month. Also discussed getting the Shingrix vaccine with him. Also recommend that he discuss with urology how long they plan to continue to monitor his PSA as he is now 72 years old. Medicare Attestation I have personally reviewed: [name] medical and social history Their use of alcohol, tobacco or illicit drugs Their current medications and supplements [name] functional ability including ADLs,fall risks, home safety risks, cognitive, and hearing and visual impairment Diet and physical activities Evidence for depression or mood disorders  [name] weight, height, and BMI have been recorded in the chart.  I have made referrals, counseling, and provided education to the patient based on review of the above and I have provided the patient with a written personalized care plan  for preventive services.     Jill Alexanders, MD   12/17/2017

## 2017-12-17 NOTE — Patient Instructions (Addendum)
Get a new blood pressure machine and start checking your pressure in the resting sitting position and bring those numbers in with with a blood pressure machine in about a month.  [name] , Thank you for taking time to come for your Medicare Wellness Visit. I appreciate your ongoing commitment to your health goals. Please review the following plan we discussed and let me know if I can assist you in the future.   These are the goals we discussed: Goals    None      This is a list of the screening recommended for you and due dates:  Health Maintenance  Topic Date Due  . Pneumonia vaccines (2 of 2 - PPSV23) 01/24/2015  . Flu Shot  02/11/2018  . Colon Cancer Screening  07/14/2018  . Tetanus Vaccine  12/11/2024  .  Hepatitis C: One time screening is recommended by Center for Disease Control  (CDC) for  adults born from [birth year] through 1964.   Completed

## 2017-12-18 LAB — CBC WITH DIFFERENTIAL/PLATELET
BASOS ABS: 0 10*3/uL (ref 0.0–0.2)
Basos: 1 %
EOS (ABSOLUTE): 0.2 10*3/uL (ref 0.0–0.4)
Eos: 4 %
Hematocrit: 46.3 % (ref 37.5–51.0)
Hemoglobin: 16.6 g/dL (ref 13.0–17.7)
Immature Grans (Abs): 0 10*3/uL (ref 0.0–0.1)
Immature Granulocytes: 0 %
LYMPHS ABS: 1.6 10*3/uL (ref 0.7–3.1)
Lymphs: 34 %
MCH: 31.9 pg (ref 26.6–33.0)
MCHC: 35.9 g/dL — AB (ref 31.5–35.7)
MCV: 89 fL (ref 79–97)
MONOS ABS: 0.6 10*3/uL (ref 0.1–0.9)
Monocytes: 11 %
Neutrophils Absolute: 2.5 10*3/uL (ref 1.4–7.0)
Neutrophils: 50 %
Platelets: 283 10*3/uL (ref 150–450)
RBC: 5.2 x10E6/uL (ref 4.14–5.80)
RDW: 12.9 % (ref 12.3–15.4)
WBC: 4.9 10*3/uL (ref 3.4–10.8)

## 2017-12-18 LAB — COMPREHENSIVE METABOLIC PANEL
ALK PHOS: 50 IU/L (ref 39–117)
ALT: 28 IU/L (ref 0–44)
AST: 25 IU/L (ref 0–40)
Albumin/Globulin Ratio: 1.7 (ref 1.2–2.2)
Albumin: 4.7 g/dL (ref 3.5–4.8)
BUN/Creatinine Ratio: 18 (ref 10–24)
BUN: 12 mg/dL (ref 8–27)
Bilirubin Total: 0.7 mg/dL (ref 0.0–1.2)
CO2: 28 mmol/L (ref 20–29)
CREATININE: 0.65 mg/dL — AB (ref 0.76–1.27)
Calcium: 9.9 mg/dL (ref 8.6–10.2)
Chloride: 98 mmol/L (ref 96–106)
GFR calc Af Amer: 113 mL/min/{1.73_m2} (ref >59)
GFR calc non Af Amer: 98 mL/min/{1.73_m2} (ref >59)
GLOBULIN, TOTAL: 2.8 g/dL (ref 1.5–4.5)
GLUCOSE: 104 mg/dL — AB (ref 65–99)
Potassium: 4.4 mmol/L (ref 3.5–5.2)
SODIUM: 139 mmol/L (ref 134–144)
Total Protein: 7.5 g/dL (ref 6.0–8.5)

## 2017-12-18 LAB — LIPID PANEL
CHOLESTEROL TOTAL: 197 mg/dL (ref 100–199)
Chol/HDL Ratio: 3.5 ratio (ref 0.0–5.0)
HDL: 56 mg/dL (ref >39)
LDL CALC: 125 mg/dL — AB (ref 0–99)
TRIGLYCERIDES: 81 mg/dL (ref 0–149)
VLDL Cholesterol Cal: 16 mg/dL (ref 5–40)

## 2018-02-19 ENCOUNTER — Other Ambulatory Visit: Payer: Self-pay | Admitting: Family Medicine

## 2018-02-19 DIAGNOSIS — I10 Essential (primary) hypertension: Secondary | ICD-10-CM

## 2018-03-26 ENCOUNTER — Telehealth: Payer: Self-pay | Admitting: Family Medicine

## 2018-03-26 NOTE — Telephone Encounter (Signed)
Pt's wife, Sherlynn Stalls, requested a letter to excuse pt from jury duty in November so that he can be home to help with disabled daughter.

## 2018-03-29 NOTE — Telephone Encounter (Signed)
yours

## 2018-03-29 NOTE — Telephone Encounter (Signed)
If the hearing is not documented in the record, get that in and then we will write him a note for that and for his daughter.

## 2018-03-29 NOTE — Telephone Encounter (Signed)
Spoke with [employer].  [name] only works 10 - 15 hours per week, if at all.  He now stays home with [name] and [name].  She does not have anyone else helping her.  [name] also can not hear.  They are working on getting hearing aids. Please advise.

## 2018-03-30 ENCOUNTER — Encounter: Payer: Self-pay | Admitting: Family Medicine

## 2018-03-30 DIAGNOSIS — H9190 Unspecified hearing loss, unspecified ear: Secondary | ICD-10-CM | POA: Insufficient documentation

## 2018-03-30 NOTE — Telephone Encounter (Signed)
Write the letter for hearing loss and care for his daughter

## 2018-03-30 NOTE — Telephone Encounter (Signed)
It is now in his problem list.

## 2018-03-30 NOTE — Telephone Encounter (Signed)
Letter e-mailed to patient.

## 2018-04-23 ENCOUNTER — Ambulatory Visit (INDEPENDENT_AMBULATORY_CARE_PROVIDER_SITE_OTHER): Payer: Medicare Other | Admitting: Family Medicine

## 2018-04-23 ENCOUNTER — Ambulatory Visit
Admission: RE | Admit: 2018-04-23 | Discharge: 2018-04-23 | Disposition: A | Discharge status: Home / Self Care | Payer: Medicare Other | Source: Ambulatory Visit | Attending: Family Medicine | Admitting: Family Medicine

## 2018-04-23 ENCOUNTER — Encounter: Payer: Self-pay | Admitting: Family Medicine

## 2018-04-23 VITALS — BP 130/82 | HR 71 | Temp 97.7°F | Resp 16 | Wt 176.2 lb

## 2018-04-23 DIAGNOSIS — M25521 Pain in right elbow: Principal | ICD-10-CM

## 2018-04-23 DIAGNOSIS — L03113 Cellulitis of right upper limb: Secondary | ICD-10-CM | POA: Diagnosis not present

## 2018-04-23 DIAGNOSIS — M7989 Other specified soft tissue disorders: Secondary | ICD-10-CM | POA: Diagnosis not present

## 2018-04-23 DIAGNOSIS — M25421 Effusion, right elbow: Secondary | ICD-10-CM

## 2018-04-23 DIAGNOSIS — Z23 Encounter for immunization: Secondary | ICD-10-CM | POA: Diagnosis not present

## 2018-04-23 MED ORDER — SULFAMETHOXAZOLE-TRIMETHOPRIM 800-160 MG PO TABS: 1.0000 | ORAL_TABLET | Freq: Two times a day (BID) | ORAL | 0 refills | Status: DC

## 2018-04-23 NOTE — Progress Notes (Signed)
   Subjective:    Patient ID: [name]., male    DOB: 10-Dec-1945, 72 y.o.   MRN: [id number]  HPI Chief Complaint  Patient presents with  . elbow red    elbow red and tender. warm to touch, swollen   Her with complaints of a 2 day history of right elbow swelling, tenderness and increasing redness and warmth. Denies injury or history of surgery to elbow.  Pain is intermittent but not bad enough to take Tylenol or ibuprofen per patient.  Denies fever, chills, nausea, vomiting, diarrhea. no numbness, tingling or weakness. No other arthralgias.   He is immunocompetent.   Reviewed allergies, medications, past medical, surgical, family, and social history.   Review of Systems Pertinent positives and negatives in the history of present illness.     Objective:   Physical Exam  Constitutional: He is oriented to person, place, and time. He appears well-developed and well-nourished. No distress.  Eyes: Conjunctivae are normal.  Neck: Normal range of motion. Neck supple.  Cardiovascular: Normal rate and regular rhythm.  Pulmonary/Chest: Effort normal and breath sounds normal.  Musculoskeletal:       Right elbow: He exhibits swelling. He exhibits normal range of motion. No tenderness found.       Arms: Large area of erythema, warmth and swelling around right elbow. Non tender. Normal sensation, pulses, cap refill, ROM and strength compared to LUE   Neurological: He is alert and oriented to person, place, and time.  Skin: Skin is warm and dry. Capillary refill takes less than 2 seconds.   BP 130/82   Pulse 71   Temp 97.7 F (36.5 C) (Oral)   Resp 16   Wt 176 lb 3.2 oz (79.9 kg)   SpO2 98%   BMI 26.02 kg/m       Assessment & Plan:  Pain and swelling of right elbow - Plan: DG Elbow Complete Right, sulfamethoxazole-trimethoprim (BACTRIM DS,SEPTRA DS) 800-160 MG tablet  Needs flu shot - Plan: Flu vaccine HIGH DOSE PF (Fluzone High Dose)  Cellulitis of right upper extremity -  Plan: sulfamethoxazole-trimethoprim (BACTRIM DS,SEPTRA DS) 800-160 MG tablet  RUE is neurovascularly intact, good sensation and ROM of elbow without pain.  No compartment syndrome.  Area of erythema marked today. Will cover him with antibiotics and send him for an XR.  Strict precaution that if redness goes far outside marked area or if he develops fever, severe pain or any worsening symptoms that he should call or be seen right away.  Schedule to return Monday morning for a recheck.

## 2018-04-26 ENCOUNTER — Encounter: Payer: Self-pay | Admitting: Family Medicine

## 2018-04-26 ENCOUNTER — Ambulatory Visit (INDEPENDENT_AMBULATORY_CARE_PROVIDER_SITE_OTHER): Payer: Medicare Other | Admitting: Family Medicine

## 2018-04-26 VITALS — BP 124/80 | HR 56 | Wt 175.4 lb

## 2018-04-26 DIAGNOSIS — L03113 Cellulitis of right upper limb: Secondary | ICD-10-CM | POA: Diagnosis not present

## 2018-04-26 NOTE — Progress Notes (Signed)
   Subjective:    Patient ID: [name]., male    DOB: 22-Mar-1946, 72 y.o.   MRN: [id number]  HPI Chief Complaint  Patient presents with  . right elbow    follow-up right elbow   He is here to follow up on right elbow swelling, erythema and tenderness with no known injury.  Reports improvement. Taking Bactrim as prescribed. Has taken Tylenol twice over the weekend.   Denies fever, chills, nausea, vomiting.  No other joint involvement.    Review of Systems Pertinent positives and negatives in the history of present illness.     Objective:   Physical Exam BP 124/80   Pulse (!) 56   Wt 175 lb 6.4 oz (79.6 kg)   BMI 25.90 kg/m   Right elbow with mild erythema but much smaller area now, minimal edema, non tender, normal sensation, ROM and strength.       Assessment & Plan:  Cellulitis of right upper extremity  Reviewed XR which showed posterior soft tissue swelling and questionable olecranon bursitis.  Significant improvement with only mild erythema now. Swelling basically resolved. He will complete the antibiotic as prescribed and follow up later this week if any worsening symptoms.

## 2018-04-29 ENCOUNTER — Other Ambulatory Visit: Payer: Medicare Other

## 2018-05-17 ENCOUNTER — Other Ambulatory Visit: Payer: Self-pay | Admitting: Family Medicine

## 2018-05-17 DIAGNOSIS — I10 Essential (primary) hypertension: Secondary | ICD-10-CM

## 2018-07-29 DIAGNOSIS — H5315 Visual distortions of shape and size: Secondary | ICD-10-CM | POA: Diagnosis not present

## 2018-07-29 DIAGNOSIS — H53001 Unspecified amblyopia, right eye: Secondary | ICD-10-CM | POA: Diagnosis not present

## 2018-07-29 DIAGNOSIS — H26492 Other secondary cataract, left eye: Secondary | ICD-10-CM | POA: Diagnosis not present

## 2018-07-29 DIAGNOSIS — Z961 Presence of intraocular lens: Secondary | ICD-10-CM | POA: Diagnosis not present

## 2018-08-05 DIAGNOSIS — H26491 Other secondary cataract, right eye: Secondary | ICD-10-CM | POA: Diagnosis not present

## 2018-08-11 ENCOUNTER — Other Ambulatory Visit: Payer: Self-pay | Admitting: Family Medicine

## 2018-08-11 DIAGNOSIS — I10 Essential (primary) hypertension: Secondary | ICD-10-CM

## 2018-10-25 ENCOUNTER — Ambulatory Visit: Payer: Medicare Other | Admitting: Internal Medicine

## 2018-11-04 ENCOUNTER — Telehealth: Payer: Self-pay

## 2018-11-04 NOTE — Telephone Encounter (Signed)
Virtual Visit Pre-Appointment Phone Call  "(Name), I am calling you today to discuss your upcoming appointment. We are currently trying to limit exposure to the virus that causes COVID-19 by seeing patients at home rather than in the office."  1. "What is the BEST phone number to call the day of the visit?" - include this in appointment notes  2. "Do you have or have access to (through a family member/friend) a smartphone with video capability that we can use for your visit?" a. If yes - list this number in appt notes as "cell" (if different from BEST phone #) and list the appointment type as a VIDEO visit in appointment notes b. If no - list the appointment type as a PHONE visit in appointment notes  3. Confirm consent - "In the setting of the current Covid19 crisis, you are scheduled for a PHONE visit with your provider on 4/27 at 9am.  Just as we do with many in-office visits, in order for you to participate in this visit, we must obtain consent.  If you'd like, I can send this to your mychart (if signed up) or email for you to review.  Otherwise, I can obtain your verbal consent now.  All virtual visits are billed to your insurance company just like a normal visit would be.  By agreeing to a virtual visit, we'd like you to understand that the technology does not allow for your provider to perform an examination, and thus may limit your provider's ability to fully assess your condition. If your provider identifies any concerns that need to be evaluated in person, we will make arrangements to do so.  Finally, though the technology is pretty good, we cannot assure that it will always work on either your or our end, and in the setting of a video visit, we may have to convert it to a phone-only visit.  In either situation, we cannot ensure that we have a secure connection.  Are you willing to proceed?" STAFF: Did the patient verbally acknowledge consent to telehealth visit? Document YES/NO here: YES  4. Advise patient to be prepared - "Two hours prior to your appointment, go ahead and check your blood pressure, pulse, oxygen saturation, and your weight (if you have the equipment to check those) and write them all down. When your visit starts, your provider will ask you for this information. If you have an Apple Watch or Kardia device, please plan to have heart rate information ready on the day of your appointment. Please have a pen and paper handy nearby the day of the visit as well."  5. Give patient instructions for MyChart download to smartphone OR [email or web address] as below if video visit (depending on what platform provider is using)  6. Inform patient they will receive a phone call 15 minutes prior to their appointment time (may be from unknown caller ID) so they should be prepared to answer    TELEPHONE CALL NOTE  Samuel HEDGEPATH Sr. has been deemed a candidate for a follow-up tele-health visit to limit community exposure during the Covid-19 pandemic. I spoke with the patient via phone to ensure availability of phone/video source, confirm preferred email & phone number, and discuss instructions and expectations.  I reminded Samuel WANDLER Sr. to be prepared with any vital sign and/or heart rhythm information that could potentially be obtained via home monitoring, at the time of his visit. I reminded Samuel HAYWOOD Sr. to expect a phone call prior to  his visit.  [name], RN 11/04/2018 2:47 PM

## 2018-11-05 ENCOUNTER — Other Ambulatory Visit: Payer: Self-pay

## 2018-11-07 ENCOUNTER — Other Ambulatory Visit: Payer: Self-pay | Admitting: Family Medicine

## 2018-11-07 DIAGNOSIS — I10 Essential (primary) hypertension: Secondary | ICD-10-CM

## 2018-11-08 ENCOUNTER — Other Ambulatory Visit: Payer: Self-pay

## 2018-11-08 ENCOUNTER — Telehealth (INDEPENDENT_AMBULATORY_CARE_PROVIDER_SITE_OTHER): Payer: Medicare Other | Admitting: Internal Medicine

## 2018-11-08 ENCOUNTER — Other Ambulatory Visit: Payer: Self-pay | Admitting: Family Medicine

## 2018-11-08 ENCOUNTER — Encounter: Payer: Self-pay | Admitting: Internal Medicine

## 2018-11-08 VITALS — BP 116/70 | HR 72 | Ht 69.0 in | Wt 172.0 lb

## 2018-11-08 DIAGNOSIS — I10 Essential (primary) hypertension: Secondary | ICD-10-CM | POA: Diagnosis not present

## 2018-11-08 DIAGNOSIS — E785 Hyperlipidemia, unspecified: Secondary | ICD-10-CM

## 2018-11-08 NOTE — Patient Instructions (Signed)
Medication Instructions:  No changes If you need a refill on your cardiac medications before your next appointment, please call your pharmacy.   Lab work: None If you have labs (blood work) drawn today and your tests are completely normal, you will receive your results only by: . MyChart Message (if you have MyChart) OR . A paper copy in the mail If you have any lab test that is abnormal or we need to change your treatment, we will call you to review the results.  Testing/Procedures: none  Follow-Up: At [facility], you and your health needs are our priority.  As part of our continuing mission to provide you with exceptional heart care, we have created designated Provider Care Teams.  These Care Teams include your primary Cardiologist (physician) and Advanced Practice Providers (APPs -  Physician Assistants and Nurse Practitioners) who all work together to provide you with the care you need, when you need it. You will need a follow up appointment in:  November - 7 months.  Please call our office 2 months in advance to schedule this appointment.  You may see Paula Ross, MD  or one of the following Advanced Practice Providers on your designated Care Team: Scott Weaver, PA-C Vin Bhagat, PA-C . Janine Hammond, NP  Any Other Special Instructions Will Be Listed Below (If Applicable).    

## 2018-11-08 NOTE — Progress Notes (Signed)
Virtual Visit via Video Note   This visit type was conducted due to national recommendations for restrictions regarding the COVID-19 Pandemic (e.g. social distancing) in an effort to limit this patient's exposure and mitigate transmission in our community.  Due to his co-morbid illnesses, this patient is at least at moderate risk for complications without adequate follow up.  This format is felt to be most appropriate for this patient at this time.  All issues noted in this document were discussed and addressed.  A limited physical exam was performed with this format.  Please refer to the patient's chart for his consent to telehealth for [facility].   Evaluation Performed:  Follow-up visit  Date:  11/08/2018   ID:  Samuel Police Sr., DOB 1945-09-12, MRN [id number]  Patient Location: Home Provider Location: Home  PCP:  [name], MD  Cardiologist: Harrington Challenger Electrophysiologist:  None   Chief Complaint:  F/U of HTN and HL  History of Present Illness:    Samuel MIKLES Sr. is a 73 y.o. male with HTN and HL  Did not tolerate lipitor in past    I saw him in march 2019    Last lipid panel in June 2019 LDL was 125  HDL 56 Since I saw him the pt denies SOB   Walking   No CP    Uses treadmill  The patient does not  have symptoms concerning for COVID-19 infection (fever, chills, cough, or new shortness of breath).    Past Medical History:  Diagnosis Date  . Dyslipidemia   . HTN (hypertension)   . Scoliosis    Past Surgical History:  Procedure Laterality Date  . COLONOSCOPY  2010   [facility]  . HERNIA REPAIR    . HIP SURGERY    . right inguinal hernia    . scoliosis     x2  . TONSILLECTOMY       Current Meds  Medication Sig  . Ascorbic Acid (VITAMIN C) 500 MG tablet Take 500 mg by mouth daily. 2 tabs daily   . aspirin 81 MG tablet Take 81 mg by mouth daily.    Marland Kitchen co-enzyme Q-10 30 MG capsule Take 50 mg by mouth daily.   . Multiple Vitamin (MULTIVITAMIN) capsule Take 1  capsule by mouth daily.    . Omega-3 Fatty Acids (FISH OIL PO) Take 2 tablets by mouth daily.    Marland Kitchen PROBIOTIC CAPS Take by mouth daily.    . Psyllium (METAMUCIL PO) Take 19 g by mouth. As directed  . Red Yeast Rice POWD 600 mg 2 tabs two times per day  . [DISCONTINUED] lisinopril-hydrochlorothiazide (PRINZIDE,ZESTORETIC) 20-12.5 MG tablet TAKE 1 TABLET BY MOUTH ONCE DAILY     Allergies:   Patient has no known allergies.   Social History   Tobacco Use  . Smoking status: Never Smoker  . Smokeless tobacco: Never Used  Substance Use Topics  . Alcohol use: No  . Drug use: No     Family Hx: [name] family history includes Cancer in his brother, father, and sister; Coronary artery disease in his brother; Heart attack in his mother; Heart failure in his mother.  ROS:   Please see the history of present illness.     All other systems reviewed and are negative.   Prior CV studies:   The following studies were reviewed today: No testing reviewed  Labs/Other Tests and Data Reviewed:    EKG:  Not done as tele visit Recent  Labs: 12/17/2017: ALT 28; BUN 12; Creatinine, Ser 0.65; Hemoglobin 16.6; Platelets 283; Potassium 4.4; Sodium 139   Recent Lipid Panel Lab Results  Component Value Date/Time   CHOL 197 12/17/2017 09:26 AM   TRIG 81 12/17/2017 09:26 AM   HDL 56 12/17/2017 09:26 AM   CHOLHDL 3.5 12/17/2017 09:26 AM   CHOLHDL 3.3 12/16/2016 08:54 AM   LDLCALC 125 (H) 12/17/2017 09:26 AM    Wt Readings from Last 3 Encounters:  11/08/18 172 lb (78 kg)  04/26/18 175 lb 6.4 oz (79.6 kg)  04/23/18 176 lb 3.2 oz (79.9 kg)     Objective:    Vital Signs:  BP 116/70   Pulse 72   Ht 5\' 9"  (1.753 m)   Wt 172 lb (78 kg)   BMI 25.40 kg/m   Vitals reviewed     ASSESSMENT & PLAN:    1.  HTN  BP is good  2l  HL  Last LDL fair   125  Discussed diet   Watch fats   Will get labs from PCP  Keep active    3  COVID-19 Education: The signs and symptoms of COVID-19 were  discussed with the patient and how to seek care for testing (follow up with PCP or arrange E-visit).  The importance of social distancing was discussed today.  Time:   Today, I have spent 20 minutes with the patient with telehealth technology discussing the above problems.     Medication Adjustments/Labs and Tests Ordered: Current medicines are reviewed at length with the patient today.  Concerns regarding medicines are outlined above.   Tests Ordered: No orders of the defined types were placed in this encounter.   Medication Changes: No orders of the defined types were placed in this encounter.   Disposition:  Follow up Next winter    Signed, [name], MD  11/08/2018 10:10 AM    [place]

## 2018-12-20 ENCOUNTER — Ambulatory Visit (INDEPENDENT_AMBULATORY_CARE_PROVIDER_SITE_OTHER): Payer: Medicare Other | Admitting: Family Medicine

## 2018-12-20 ENCOUNTER — Other Ambulatory Visit: Payer: Self-pay

## 2018-12-20 ENCOUNTER — Encounter: Payer: Self-pay | Admitting: Family Medicine

## 2018-12-20 VITALS — BP 167/86 | HR 64 | Wt 171.0 lb

## 2018-12-20 DIAGNOSIS — I10 Essential (primary) hypertension: Secondary | ICD-10-CM

## 2018-12-20 DIAGNOSIS — M4120 Other idiopathic scoliosis, site unspecified: Secondary | ICD-10-CM

## 2018-12-20 DIAGNOSIS — Z1211 Encounter for screening for malignant neoplasm of colon: Secondary | ICD-10-CM

## 2018-12-20 DIAGNOSIS — H9113 Presbycusis, bilateral: Secondary | ICD-10-CM | POA: Diagnosis not present

## 2018-12-20 DIAGNOSIS — E785 Hyperlipidemia, unspecified: Secondary | ICD-10-CM | POA: Diagnosis not present

## 2018-12-20 MED ORDER — LISINOPRIL-HYDROCHLOROTHIAZIDE 20-12.5 MG PO TABS: 1.0000 | ORAL_TABLET | Freq: Every day | ORAL | 3 refills | Status: DC

## 2018-12-20 NOTE — Progress Notes (Signed)
Samuel WAHLEN Sr. is a 73 y.o. male who presents for annual wellness visit and follow-up on chronic medical conditions.  He has no particular concerns or complaints.  He does check his blood pressure at home and the cuff is apparently fairly accurate.  He continues on lisinopril.  He is also taking OTC medications as well as using Metamucil and red yeast rice.  He has previous history of scoliosis however he is continuing to work with no plans on stopping.  He does not have an advanced directive.  He recently started wearing a hearing aid. He does not smoke or drink.  His marriage is going well.  He and his wife are taking care of their daughter who has Angelman syndrome.  Immunizations and Health Maintenance Immunization History  Administered Date(s) Administered  . Influenza Split 05/20/2011, 04/23/2012  . Influenza, High Dose Seasonal PF 04/21/2013, 05/11/2014, 03/28/2015, 05/01/2016, 05/05/2017, 04/23/2018  . Pneumococcal Conjugate-13 01/23/2014  . Pneumococcal Polysaccharide-23 07/21/2007  . Td 12/12/2014  . Tdap 12/15/2003  . Zoster 07/21/2007   Health Maintenance Due  Topic Date Due  . PNA vac Low Risk Adult (2 of 2 - PPSV23) 01/24/2015  . COLONOSCOPY  07/14/2018    Last colonoscopy:07/14/2008 Last PSA:07/14/2018 urology Dentist: q six months Ophtho: 07/2018 Exercise: walking 25 min a day  Other doctors caring for patient include: Dr. Harrington Challenger cardiology, Dr. Chrystine Oiler urology   Advanced Directives:Does Patient Have a Medical Advance Directive?: No Would patient like information on creating a medical advance directive?: Yes (MAU/Ambulatory/Procedural Areas - Information given) No.  Information given on his last visit.  Encouraged him to have this filled out.  Depression screen:  See questionnaire below.     Depression screen [facility] 2/9 12/20/2018 12/17/2017 12/16/2016 12/13/2015 12/12/2014  Decreased Interest 0 0 0 0 0  Down, Depressed, Hopeless 0 0 0 0 0  PHQ - 2 Score 0 0 0 0 0    Fall  Screen: See Questionaire below.   Fall Risk  12/20/2018 12/17/2017 12/16/2016 12/13/2015 12/12/2014  Falls in the past year? 0 No No No No    ADL screen:  See questionnaire below.  Functional Status Survey: Is the patient deaf or have difficulty hearing?: No Does the patient have difficulty seeing, even when wearing glasses/contacts?: No Does the patient have difficulty concentrating, remembering, or making decisions?: No Does the patient have difficulty walking or climbing stairs?: No Does the patient have difficulty dressing or bathing?: No Does the patient have difficulty doing errands alone such as visiting a doctor's office or shopping?: No   Review of Systems  Constitutional: -, -unexpected weight change, -anorexia, -fatigue Allergy: -sneezing, -itching, -congestion Dermatology: denies changing moles, rash, lumps ENT: -runny nose, -ear pain, -sore throat,  Cardiology:  -chest pain, -palpitations, -orthopnea, Respiratory: -cough, -shortness of breath, -dyspnea on exertion, -wheezing,  Gastroenterology: -abdominal pain, -nausea, -vomiting, -diarrhea, -constipation, -dysphagia Hematology: -bleeding or bruising problems Musculoskeletal: -arthralgias, -myalgias, -joint swelling, -back pain, - Ophthalmology: -vision changes,  Urology: -dysuria, -difficulty urinating,  -urinary frequency, -urgency, incontinence Neurology: -, -numbness, , -memory loss, -falls, -dizziness    PHYSICAL EXAM:  BP (!) 167/86   Pulse 64   Wt 171 lb (77.6 kg)   BMI 25.25 kg/m  Repeat blood pressure 135/89 General Appearance: Alert, cooperative, no distress, appears stated age Head: Normocephalic, without obvious abnormality, atraumatic  ASSESSMENT/PLAN: Essential hypertension - Plan: CBC with Differential/Platelet, Comprehensive metabolic panel, lisinopril-hydrochlorothiazide (ZESTORETIC) 20-12.5 MG tablet  Hyperlipidemia LDL goal <100 - Plan: Lipid panel  Presbycusis of both ears  Other idiopathic  scoliosis, unspecified spinal region  Screening for colon cancer - Plan: Cologuard . Immunization recommendations discussed.  Colonoscopy recommendations reviewed. Discussed his blood pressure and recommend that he always maintain it below 130/80.  According to his measurements, he has been able to do that.  Medicare Attestation I have personally reviewed: [name] medical and social history Their use of alcohol, tobacco or illicit drugs Their current medications and supplements [name] functional ability including ADLs,fall risks, home safety risks, cognitive, and hearing and visual impairment Diet and physical activities Evidence for depression or mood disorders  [name] weight, height, and BMI have been recorded in the chart.  I have made referrals, counseling, and provided education to the patient based on review of the above and I have provided the patient with a written personalized care plan for preventive services.     Jill Alexanders, MD   12/20/2018

## 2018-12-20 NOTE — Patient Instructions (Signed)
  [name] , Thank you for taking time to come for your Medicare Wellness Visit. I appreciate your ongoing commitment to your health goals. Please review the following plan we discussed and let me know if I can assist you in the future.   These are the goals we discussed: Goals   None     This is a list of the screening recommended for you and due dates:  Health Maintenance  Topic Date Due  . Pneumonia vaccines (2 of 2 - PPSV23) 01/24/2015  . Colon Cancer Screening  07/14/2018  . Flu Shot  02/12/2019  . Tetanus Vaccine  12/11/2024  .  Hepatitis C: One time screening is recommended by Center for Disease Control  (CDC) for  adults born from [birth year] through 1964.   Completed

## 2018-12-23 ENCOUNTER — Other Ambulatory Visit: Payer: Medicare Other

## 2018-12-23 ENCOUNTER — Other Ambulatory Visit: Payer: Self-pay

## 2018-12-23 DIAGNOSIS — E785 Hyperlipidemia, unspecified: Secondary | ICD-10-CM | POA: Diagnosis not present

## 2018-12-23 DIAGNOSIS — I10 Essential (primary) hypertension: Secondary | ICD-10-CM

## 2018-12-23 LAB — LIPID PANEL

## 2018-12-24 LAB — COMPREHENSIVE METABOLIC PANEL
ALT: 29 IU/L (ref 0–44)
AST: 27 IU/L (ref 0–40)
Albumin/Globulin Ratio: 1.7 (ref 1.2–2.2)
Albumin: 4.3 g/dL (ref 3.7–4.7)
Alkaline Phosphatase: 47 IU/L (ref 39–117)
BUN/Creatinine Ratio: 22 (ref 10–24)
BUN: 16 mg/dL (ref 8–27)
Bilirubin Total: 0.7 mg/dL (ref 0.0–1.2)
CO2: 28 mmol/L (ref 20–29)
Calcium: 9.3 mg/dL (ref 8.6–10.2)
Chloride: 99 mmol/L (ref 96–106)
Creatinine, Ser: 0.73 mg/dL — ABNORMAL LOW (ref 0.76–1.27)
GFR calc Af Amer: 107 mL/min/{1.73_m2} (ref >59)
GFR calc non Af Amer: 93 mL/min/{1.73_m2} (ref >59)
Globulin, Total: 2.6 g/dL (ref 1.5–4.5)
Glucose: 106 mg/dL — ABNORMAL HIGH (ref 65–99)
Potassium: 4.8 mmol/L (ref 3.5–5.2)
Sodium: 139 mmol/L (ref 134–144)
Total Protein: 6.9 g/dL (ref 6.0–8.5)

## 2018-12-24 LAB — CBC WITH DIFFERENTIAL/PLATELET
Basophils Absolute: 0.1 10*3/uL (ref 0.0–0.2)
Basos: 1 %
EOS (ABSOLUTE): 0.2 10*3/uL (ref 0.0–0.4)
Eos: 4 %
Hematocrit: 43 % (ref 37.5–51.0)
Hemoglobin: 14.6 g/dL (ref 13.0–17.7)
Immature Grans (Abs): 0 10*3/uL (ref 0.0–0.1)
Immature Granulocytes: 0 %
Lymphocytes Absolute: 1.6 10*3/uL (ref 0.7–3.1)
Lymphs: 33 %
MCH: 30.5 pg (ref 26.6–33.0)
MCHC: 34 g/dL (ref 31.5–35.7)
MCV: 90 fL (ref 79–97)
Monocytes Absolute: 0.5 10*3/uL (ref 0.1–0.9)
Monocytes: 10 %
Neutrophils Absolute: 2.6 10*3/uL (ref 1.4–7.0)
Neutrophils: 52 %
Platelets: 284 10*3/uL (ref 150–450)
RBC: 4.78 x10E6/uL (ref 4.14–5.80)
RDW: 11.9 % (ref 11.6–15.4)
WBC: 5 10*3/uL (ref 3.4–10.8)

## 2018-12-24 LAB — LIPID PANEL
Chol/HDL Ratio: 3.5 ratio (ref 0.0–5.0)
Cholesterol, Total: 184 mg/dL (ref 100–199)
HDL: 52 mg/dL (ref >39)
LDL Calculated: 118 mg/dL — ABNORMAL HIGH (ref 0–99)
Triglycerides: 72 mg/dL (ref 0–149)
VLDL Cholesterol Cal: 14 mg/dL (ref 5–40)

## 2018-12-28 DIAGNOSIS — Z1211 Encounter for screening for malignant neoplasm of colon: Secondary | ICD-10-CM | POA: Diagnosis not present

## 2018-12-29 LAB — COLOGUARD: Cologuard: POSITIVE — AB

## 2019-01-04 DIAGNOSIS — R195 Other fecal abnormalities: Secondary | ICD-10-CM

## 2019-01-07 ENCOUNTER — Encounter: Payer: Self-pay | Admitting: Internal Medicine

## 2019-01-10 ENCOUNTER — Encounter: Payer: Self-pay | Admitting: Family Medicine

## 2019-01-24 ENCOUNTER — Ambulatory Visit: Payer: Medicare Other

## 2019-01-24 ENCOUNTER — Other Ambulatory Visit: Payer: Self-pay

## 2019-01-24 VITALS — Ht 71.0 in | Wt 170.0 lb

## 2019-01-24 DIAGNOSIS — R195 Other fecal abnormalities: Secondary | ICD-10-CM

## 2019-01-24 MED ORDER — SUPREP BOWEL PREP KIT 17.5-3.13-1.6 GM/177ML PO SOLN: 1.0000 | Freq: Once | ORAL | 0 refills | Status: AC

## 2019-01-24 NOTE — Progress Notes (Signed)
Per pt, no allergies to soy or egg products.Pt not taking any weight loss meds or using  O2 at home.  Pt denies sedation problems. Pt refused emmi video.  The PV was done over the phone due to COVID-19. I spoke with the pt and wife at the same time. Verified pt's address and insurance with pt. Reviewed medical hx and prep instructions and will mail paperwork to the pt. Informed pt to call with any questions or changes prior to his procedure. Pt understood.

## 2019-02-03 ENCOUNTER — Telehealth: Payer: Self-pay | Admitting: Gastroenterology

## 2019-02-03 NOTE — Telephone Encounter (Signed)

## 2019-02-04 ENCOUNTER — Other Ambulatory Visit: Payer: Self-pay

## 2019-02-04 ENCOUNTER — Encounter: Payer: Self-pay | Admitting: Gastroenterology

## 2019-02-04 ENCOUNTER — Ambulatory Visit (AMBULATORY_SURGERY_CENTER): Payer: Medicare Other | Admitting: Gastroenterology

## 2019-02-04 VITALS — BP 112/66 | HR 68 | Temp 97.4°F | Resp 18 | Ht 71.0 in | Wt 170.0 lb

## 2019-02-04 DIAGNOSIS — R195 Other fecal abnormalities: Secondary | ICD-10-CM | POA: Diagnosis not present

## 2019-02-04 DIAGNOSIS — K635 Polyp of colon: Secondary | ICD-10-CM

## 2019-02-04 DIAGNOSIS — D122 Benign neoplasm of ascending colon: Secondary | ICD-10-CM

## 2019-02-04 MED ORDER — SODIUM CHLORIDE 0.9 % IV SOLN: 500.0000 mL | Freq: Once | INTRAVENOUS | Status: DC

## 2019-02-04 NOTE — Progress Notes (Signed)
PT taken to PACU. Monitors in place. VSS. Report given to RN. 

## 2019-02-04 NOTE — Progress Notes (Signed)
Pt's states no medical or surgical changes since previsit or office visit. 

## 2019-02-04 NOTE — Patient Instructions (Signed)
2 polyps removed today. Diverticulosis noted.     YOU HAD AN ENDOSCOPIC PROCEDURE TODAY AT [facility]:   Refer to the procedure report that was given to you for any specific questions about what was found during the examination.  If the procedure report does not answer your questions, please call your gastroenterologist to clarify.  If you requested that your care partner not be given the details of your procedure findings, then the procedure report has been included in a sealed envelope for you to review at your convenience later.  YOU SHOULD EXPECT: Some feelings of bloating in the abdomen. Passage of more gas than usual.  Walking can help get rid of the air that was put into your GI tract during the procedure and reduce the bloating. If you had a lower endoscopy (such as a colonoscopy or flexible sigmoidoscopy) you may notice spotting of blood in your stool or on the toilet paper. If you underwent a bowel prep for your procedure, you may not have a normal bowel movement for a few days.  Please Note:  You might notice some irritation and congestion in your nose or some drainage.  This is from the oxygen used during your procedure.  There is no need for concern and it should clear up in a day or so.  SYMPTOMS TO REPORT IMMEDIATELY:   Following lower endoscopy (colonoscopy or flexible sigmoidoscopy):  Excessive amounts of blood in the stool  Significant tenderness or worsening of abdominal pains  Swelling of the abdomen that is new, acute  Fever of 100F or higher   For urgent or emergent issues, a gastroenterologist can be reached at any hour by calling [phone].   DIET:  We do recommend a small meal at first, but then you may proceed to your regular diet.  Drink plenty of fluids but you should avoid alcoholic beverages for 24 hours.  ACTIVITY:  You should plan to take it easy for the rest of today and you should NOT DRIVE or use heavy machinery until tomorrow  (because of the sedation medicines used during the test).    FOLLOW UP: Our staff will call the number listed on your records 48-72 hours following your procedure to check on you and address any questions or concerns that you may have regarding the information given to you following your procedure. If we do not reach you, we will leave a message.  We will attempt to reach you two times.  During this call, we will ask if you have developed any symptoms of COVID 19. If you develop any symptoms (ie: fever, flu-like symptoms, shortness of breath, cough etc.) before then, please call [phone].  If you test positive for Covid 19 in the 2 weeks post procedure, please call and report this information to [place].    If any biopsies were taken you will be contacted by phone or by letter within the next 1-3 weeks.  Please call us at [phone] if you have not heard about the biopsies in 3 weeks.    SIGNATURES/CONFIDENTIALITY: You and/or your care partner have signed paperwork which will be entered into your electronic medical record.  These signatures attest to the fact that that the information above on your After Visit Summary has been reviewed and is understood.  Full responsibility of the confidentiality of this discharge information lies with you and/or your care-partner.

## 2019-02-04 NOTE — Progress Notes (Signed)
Called to room to assist during endoscopic procedure.  Patient ID and intended procedure confirmed with present staff. Received instructions for my participation in the procedure from the performing physician.  

## 2019-02-04 NOTE — Op Note (Signed)
[facility] Patient Name: [name] Procedure Date: 02/04/2019 10:18 AM MRN: [id number] Endoscopist: Thornton Park MD, MD Age: 73 Referring MD:  Date of Birth: March 12, 1946 Gender: Male Account #: [id number] Procedure:                Colonoscopy Indications:              Positive Cologuard test                           No known family history of colon cancer or polyps                           Normal colonoscopy with Dr. Harrell Lark 12/19/09                           No baseline GI symptoms Medicines:                See the Anesthesia note for documentation of the                            administered medications Procedure:                Pre-Anesthesia Assessment:                           - Prior to the procedure, a History and Physical                            was performed, and patient medications and                            allergies were reviewed. [name] tolerance of                            previous anesthesia was also reviewed. The risks                            and benefits of the procedure and the sedation                            options and risks were discussed with the patient.                            All questions were answered, and informed consent                            was obtained. Prior Anticoagulants: The patient has                            taken no previous anticoagulant or antiplatelet                            agents. ASA Grade Assessment: III - A patient with  severe systemic disease. After reviewing the risks                            and benefits, the patient was deemed in                            satisfactory condition to undergo the procedure.                           After obtaining informed consent, the colonoscope                            was passed under direct vision. Throughout the                            procedure, the patient's blood pressure, pulse, and             oxygen saturations were monitored continuously. The                            Model CF-HQ190L [id number]) scope was introduced                            through the anus and advanced to the the terminal                            ileum, with identification of the appendiceal                            orifice and IC valve. A second forward view of the                            right colon was performed. The colonoscopy was                            performed without difficulty. The patient tolerated                            the procedure well. The quality of the bowel                            preparation was good. The terminal ileum, ileocecal                            valve, appendiceal orifice, and rectum were                            photographed. Scope In: 10:24:41 AM Scope Out: 10:44:05 AM Scope Withdrawal Time: 0 hours 17 minutes 25 seconds  Total Procedure Duration: 0 hours 19 minutes 24 seconds  Findings:                 The perianal and digital rectal examinations were  normal.                           Multiple small and large-mouthed diverticula were                            found in the sigmoid colon and descending colon.                           A <1 mm polyp was found in the proximal ascending                            colon. The polyp was sessile. The polyp was removed                            with a cold biopsy forceps. Resection and retrieval                            were complete. Estimated blood loss was minimal.                           A 3 mm polyp was found in the distal ascending                            colon. The polyp was sessile. The polyp was removed                            with a cold snare. Resection and retrieval were                            complete. Estimated blood loss was minimal.                           The exam was otherwise without abnormality on                            direct and  retroflexion views. Complications:            No immediate complications. Estimated blood loss:                            Minimal. Estimated Blood Loss:     Estimated blood loss was minimal. Impression:               - Diverticulosis in the sigmoid colon and in the                            descending colon.                           - One <1 mm polyp in the proximal ascending colon,                            removed with a cold biopsy forceps. Resected and  retrieved.                           - One 3 mm polyp in the distal ascending colon,                            removed with a cold snare. Resected and retrieved.                           - The examination was otherwise normal on direct                            and retroflexion views. Recommendation:           - Patient has a contact number available for                            emergencies. The signs and symptoms of potential                            delayed complications were discussed with the                            patient. Return to normal activities tomorrow.                            Written discharge instructions were provided to the                            patient.                           - High fiber diet today.                           - Continue present medications.                           - Await pathology results.                           - Repeat colonoscopy date to be determined after                            pending pathology results are reviewed for                            surveillance based on pathology results. Thornton Park MD, MD 02/04/2019 10:49:39 AM This report has been signed electronically.

## 2019-02-08 ENCOUNTER — Encounter: Payer: Self-pay | Admitting: Gastroenterology

## 2019-02-08 ENCOUNTER — Telehealth: Payer: Self-pay

## 2019-02-08 NOTE — Telephone Encounter (Signed)
  Follow up Call-  Call back number 02/04/2019  Post procedure Call Back phone  # [phone]  Permission to leave phone message Yes  Some recent data might be hidden     Patient questions:  Do you have a fever, pain , or abdominal swelling? No. Pain Score  0 *  Have you tolerated food without any problems? Yes.    Have you been able to return to your normal activities? Yes.    Do you have any questions about your discharge instructions: Diet   No. Medications  No. Follow up visit  No.  Do you have questions or concerns about your Care? No.  Actions: * If pain score is 4 or above: No action needed, pain <4. 1. Have you developed a fever since your procedure? no  2.   Have you had an respiratory symptoms (SOB or cough) since your procedure? no  3.   Have you tested positive for COVID 19 since your procedure no  4.   Have you had any family members/close contacts diagnosed with the COVID 19 since your procedure?  no   If yes to any of these questions please route to Joylene John, RN and Alphonsa Gin, Therapist, sports.

## 2019-05-02 ENCOUNTER — Other Ambulatory Visit (INDEPENDENT_AMBULATORY_CARE_PROVIDER_SITE_OTHER): Payer: Medicare Other

## 2019-05-02 DIAGNOSIS — Z23 Encounter for immunization: Secondary | ICD-10-CM | POA: Diagnosis not present

## 2019-06-20 ENCOUNTER — Telehealth: Payer: Self-pay | Admitting: Internal Medicine

## 2019-06-20 NOTE — Telephone Encounter (Signed)
New Message  Patient is calling in to see if he is able to go his appointment with Dr. Harrington Challenger on 06/23/19 virtually vs having to come into office. Please give patient/patient's wife a call back to advise.

## 2019-06-21 NOTE — Telephone Encounter (Signed)
Pt's appointment changed to virtual.  Pt has been notified by scheduler.

## 2019-06-24 ENCOUNTER — Other Ambulatory Visit: Payer: Self-pay

## 2019-06-24 ENCOUNTER — Telehealth (INDEPENDENT_AMBULATORY_CARE_PROVIDER_SITE_OTHER): Payer: Medicare Other | Admitting: Internal Medicine

## 2019-06-24 ENCOUNTER — Telehealth: Payer: Self-pay

## 2019-06-24 ENCOUNTER — Encounter: Payer: Self-pay | Admitting: Internal Medicine

## 2019-06-24 DIAGNOSIS — I10 Essential (primary) hypertension: Secondary | ICD-10-CM | POA: Diagnosis not present

## 2019-06-24 DIAGNOSIS — E782 Mixed hyperlipidemia: Secondary | ICD-10-CM | POA: Diagnosis not present

## 2019-06-24 NOTE — Progress Notes (Signed)
Virtual Visit via Video Note   This visit type was conducted due to national recommendations for restrictions regarding the COVID-19 Pandemic (e.g. social distancing) in an effort to limit this patient's exposure and mitigate transmission in our community.  Due to his co-morbid illnesses, this patient is at least at moderate risk for complications without adequate follow up.  This format is felt to be most appropriate for this patient at this time.  All issues noted in this document were discussed and addressed.  A limited physical exam was performed with this format.  Please refer to the patient's chart for his consent to telehealth for [facility]   Date:  06/24/2019   ID:  Samuel Police Sr., DOB 05-11-1946, MRN [id number]  Patient Location: Home Provider Location: Office  PCP:  [name], MD  Cardiologist:  [name], MD  Electrophysiologist:  None   Evaluation Performed:  Follow-Up Visit  Chief Complaint:   F/u of HTN   History of Present Illness:    Samuel NORTZ Sr. is a 73 y.o. male with hx of HTN and HL  Intolerant to lipitor   I last saw him in televisit in April 2020    The pt says he is doing OK from cardiac standpoint   Denies CP   Breathing is OK    Walks some    The patient does not have symptoms concerning for COVID-19 infection (fever, chills, cough, or new shortness of breath).    Past Medical History:  Diagnosis Date  . Cataract    had Bil  . Cough    on and off  . Dyslipidemia   . History of colon polyps   . HTN (hypertension)   . Hyperlipidemia   . Scoliosis    Past Surgical History:  Procedure Laterality Date  . COLONOSCOPY  2010   [facility]  . HERNIA REPAIR     right inguinal hernia  . HIP SURGERY    . right inguinal hernia    . scoliosis     x2  . TONSILLECTOMY       Current Meds  Medication Sig  . Ascorbic Acid (VITAMIN C) 500 MG tablet Take 500 mg by mouth daily. 2 tabs daily   . aspirin 81 MG tablet Take 81 mg by mouth  daily.    Marland Kitchen co-enzyme Q-10 30 MG capsule Take 50 mg by mouth daily.   Marland Kitchen lisinopril-hydrochlorothiazide (ZESTORETIC) 20-12.5 MG tablet Take 1 tablet by mouth daily.  . Multiple Vitamin (MULTIVITAMIN) capsule Take 1 capsule by mouth daily.    . Omega-3 Fatty Acids (FISH OIL PO) Take 2 tablets by mouth daily.    Marland Kitchen PROBIOTIC CAPS Take by mouth daily.    . Psyllium (METAMUCIL PO) Take 19 g by mouth. As directed  . Red Yeast Rice POWD 600 mg 2 tabs two times per day     Allergies:   Patient has no known allergies.   Social History   Tobacco Use  . Smoking status: Never Smoker  . Smokeless tobacco: Never Used  Substance Use Topics  . Alcohol use: No  . Drug use: No     Family Hx: [name] family history includes COPD in his brother; Cancer in his brother, father, and sister; Coronary artery disease in his brother; Esophageal cancer in his brother; Heart attack in his mother; Heart disease in his brother and mother; Heart failure in his mother; Lung cancer in his sister.  ROS:   Please  see the history of present illness.    All other systems reviewed and are negative.     Labs/Other Tests and Data Reviewed:    EKG:  No ECG reviewed.  Recent Labs: 12/23/2018: ALT 29; BUN 16; Creatinine, Ser 0.73; Hemoglobin 14.6; Platelets 284; Potassium 4.8; Sodium 139   Recent Lipid Panel Lab Results  Component Value Date/Time   CHOL 184 12/23/2018 08:58 AM   TRIG 72 12/23/2018 08:58 AM   HDL 52 12/23/2018 08:58 AM   CHOLHDL 3.5 12/23/2018 08:58 AM   CHOLHDL 3.3 12/16/2016 08:54 AM   LDLCALC 118 (H) 12/23/2018 08:58 AM    Wt Readings from Last 3 Encounters:  06/24/19 170 lb (77.1 kg)  02/04/19 170 lb (77.1 kg)  01/24/19 170 lb (77.1 kg)     Objective:    Vital Signs:  BP (!) 157/87   Pulse (!) 57   Ht 5\' 11"  (1.803 m)   Wt 170 lb (77.1 kg)   BMI 23.71 kg/m    VITAL SIGNS:  reviewed  ASSESSMENT & PLAN:    1. HTN   BP is up a little  BP will need to be followed more  closely .   I encouraged him to check at home and  if remains high then meds will need to be adjusted  2   HL   Intolerant to statins  Watch diet  COVID-19 Education: The signs and symptoms of COVID-19 were discussed with the patient and how to seek care for testing (follow up with PCP or arrange E-visit).  The importance of social distancing was discussed today.  Time:   Today, I have spent 15 minutes with the patient with telehealth technology discussing the above problems.     Medication Adjustments/Labs and Tests Ordered: Current medicines are reviewed at length with the patient today.  Concerns regarding medicines are outlined above.   Tests Ordered: No orders of the defined types were placed in this encounter.   Medication Changes: No orders of the defined types were placed in this encounter.   Follow Up:  In Person  6 months  Signed, [name], MD  06/24/2019 10:20 AM    [place]

## 2019-06-24 NOTE — Patient Instructions (Signed)
Medication Instructions:  No changes *If you need a refill on your cardiac medications before your next appointment, please call your pharmacy*  Lab Work: None ordered If you have labs (blood work) drawn today and your tests are completely normal, you will receive your results only by: Marland Kitchen MyChart Message (if you have MyChart) OR . A paper copy in the mail If you have any lab test that is abnormal or we need to change your treatment, we will call you to review the results.  Testing/Procedures: none  Follow-Up: At [facility], you and your health needs are our priority.  As part of our continuing mission to provide you with exceptional heart care, we have created designated Provider Care Teams.  These Care Teams include your primary Cardiologist (physician) and Advanced Practice Providers (APPs -  Physician Assistants and Nurse Practitioners) who all work together to provide you with the care you need, when you need it.  Your next appointment:   12 month(s)  The format for your next appointment:   Either In Person or Virtual  Provider:   You may see [name], MD or one of the following Advanced Practice Providers on your designated Care Team:    Richardson Dopp, PA-C  [place], [place]  Daune Perch, NP   Other Instructions

## 2019-06-24 NOTE — Telephone Encounter (Signed)

## 2019-08-15 DIAGNOSIS — Z961 Presence of intraocular lens: Secondary | ICD-10-CM | POA: Diagnosis not present

## 2019-08-15 DIAGNOSIS — H40013 Open angle with borderline findings, low risk, bilateral: Secondary | ICD-10-CM | POA: Diagnosis not present

## 2019-08-15 DIAGNOSIS — H18413 Arcus senilis, bilateral: Secondary | ICD-10-CM | POA: Diagnosis not present

## 2019-08-15 DIAGNOSIS — H04123 Dry eye syndrome of bilateral lacrimal glands: Secondary | ICD-10-CM | POA: Diagnosis not present

## 2019-08-15 DIAGNOSIS — H11153 Pinguecula, bilateral: Secondary | ICD-10-CM | POA: Diagnosis not present

## 2019-08-15 DIAGNOSIS — H53021 Refractive amblyopia, right eye: Secondary | ICD-10-CM | POA: Diagnosis not present

## 2019-11-01 ENCOUNTER — Other Ambulatory Visit: Payer: Self-pay

## 2019-11-01 ENCOUNTER — Encounter: Payer: Self-pay | Admitting: Family Medicine

## 2019-11-01 ENCOUNTER — Ambulatory Visit (INDEPENDENT_AMBULATORY_CARE_PROVIDER_SITE_OTHER): Payer: PPO | Admitting: Family Medicine

## 2019-11-01 VITALS — BP 148/94 | HR 69 | Temp 97.7°F | Wt 173.8 lb

## 2019-11-01 DIAGNOSIS — M79604 Pain in right leg: Secondary | ICD-10-CM

## 2019-11-01 NOTE — Progress Notes (Signed)
   Subjective:    Patient ID: Samuel Police Sr., male    DOB: November 03, 1945, 74 y.o.   MRN: [id number]  HPI He states that several weeks ago he slipped and jarred his right hip when he stepped down.  It did not seem to bother him too much but then he did wake up with pain in the right calf ankle and hip when he would lay on the right side.  He states that walking does not cause much difficulty but if he stands for long period of time, he will have pain.  He has had no numbness, tingling or weakness.   Review of Systems     Objective:   Physical Exam Alert and in no distress.  Full motion of the hips knees and ankles is noted.  No palpable tenderness to the calf or thighs.  DTRs normal.  Negative straight leg raising.  No tenderness over lumbar or SI joint area       Assessment & Plan:  Right leg pain I explained that at this point there is no good reason as to why he is having the pain.  He is to take 2 Aleve twice per day and pay attention to anything that makes this better or worse.  Explained that there is no definite cause for this.  He had a difficult time dealing with this.  He will return here if his symptoms continue for further evaluation

## 2019-11-01 NOTE — Patient Instructions (Signed)
Take 2 Aleve twice per day for the next 10 days to 2 weeks

## 2019-11-02 ENCOUNTER — Telehealth: Payer: Self-pay | Admitting: Family Medicine

## 2019-11-02 DIAGNOSIS — M79604 Pain in right leg: Secondary | ICD-10-CM

## 2019-11-02 NOTE — Telephone Encounter (Signed)
Called pt advised go by [place] and have xray anytime except after 4:30. Also Dr. Redmond School advised he could take 2 Tylenol 4 times per day.    Pt will do

## 2019-11-02 NOTE — Telephone Encounter (Signed)
I have no problem getting an x-ray but he did not give me indication of where the he was having the most pain.  It was from the hip on down.  See if he can be more specific.

## 2019-11-02 NOTE — Telephone Encounter (Signed)
Wife called and left message that they want an xray.  [name] is hurting and been taking Tylenol for over 1 month and continues to hurt.  I called pt back reached voice mail that was full.  Please advise of xray to [facility]

## 2019-11-02 NOTE — Telephone Encounter (Signed)
Patient said you told him he could also take Tylenol, when and how many?  His pain is in his right butt area where it meets the seat when you sit down.  Also all around the ankle of his right leg.  Pain since Feb.  Please advise pt of xray and Tylenol.

## 2019-11-02 NOTE — Telephone Encounter (Signed)
Let him know that I ordered a hip x-ray.

## 2019-11-03 ENCOUNTER — Ambulatory Visit
Admission: RE | Admit: 2019-11-03 | Discharge: 2019-11-03 | Disposition: A | Discharge status: Home / Self Care | Payer: Medicare Other | Source: Ambulatory Visit | Attending: Family Medicine | Admitting: Family Medicine

## 2019-11-03 DIAGNOSIS — M1611 Unilateral primary osteoarthritis, right hip: Secondary | ICD-10-CM | POA: Diagnosis not present

## 2019-11-04 ENCOUNTER — Telehealth: Payer: Self-pay

## 2019-11-04 NOTE — Telephone Encounter (Signed)
Pt. Wife called wanting to know if we got her husbands X-ray results yet. I told her we got the results this morning but you are not in today, she wanted me to send you a message to see if you could review his x-ray results and get back with them.

## 2019-11-06 NOTE — Telephone Encounter (Signed)
He should have gotten a message from me on [email or web address]

## 2019-11-07 NOTE — Telephone Encounter (Signed)
Pt has seen message of x-ray results [facility]

## 2019-12-14 ENCOUNTER — Other Ambulatory Visit: Payer: Self-pay | Admitting: Family Medicine

## 2019-12-14 DIAGNOSIS — I10 Essential (primary) hypertension: Secondary | ICD-10-CM

## 2019-12-16 ENCOUNTER — Encounter: Payer: Self-pay | Admitting: Family Medicine

## 2019-12-16 ENCOUNTER — Other Ambulatory Visit: Payer: Self-pay

## 2019-12-16 ENCOUNTER — Ambulatory Visit (INDEPENDENT_AMBULATORY_CARE_PROVIDER_SITE_OTHER): Payer: PPO | Admitting: Family Medicine

## 2019-12-16 ENCOUNTER — Ambulatory Visit
Admission: RE | Admit: 2019-12-16 | Discharge: 2019-12-16 | Disposition: A | Discharge status: Home / Self Care | Payer: PPO | Source: Ambulatory Visit | Attending: Family Medicine | Admitting: Family Medicine

## 2019-12-16 VITALS — BP 138/76 | Temp 97.5°F | Wt 174.4 lb

## 2019-12-16 DIAGNOSIS — L989 Disorder of the skin and subcutaneous tissue, unspecified: Secondary | ICD-10-CM | POA: Diagnosis not present

## 2019-12-16 DIAGNOSIS — M25571 Pain in right ankle and joints of right foot: Secondary | ICD-10-CM

## 2019-12-16 DIAGNOSIS — G8929 Other chronic pain: Secondary | ICD-10-CM

## 2019-12-16 NOTE — Progress Notes (Signed)
   Subjective:    Patient ID: Samuel Police Sr., male    DOB: [birth year], 74 y.o.   MRN: [id number]  HPI He is here for further evaluation and treatment of right foot pain.  He states that he injured his foot when he came down hard on his foot that also caused hip pain.  His hip was evaluated with an x-ray and did show some minor changes.  He continues have foot pain especially if he stands on this for a long period of time.  No good history of previous injury prior to this.  He also has a lesion on his chest near the right [place] joint.  He would like that evaluated.   Review of Systems     Objective:   Physical Exam Alert and in no distress.  Round smooth movable 2 cm lesion noted near the right [place] joint.  The joint itself appears normal. Right foot exam shows full motion without pain.  Normal strength no laxity noted.  Pulses are good.       Assessment & Plan:  Chronic pain of right ankle - Plan: DG Ankle Complete Right  Benign skin lesion I explained that the skin lesion is nothing to be concerned about and we can watch that. Discussed the treatment of his ankle.  If the x-ray shows some degenerative changes, referral would be appropriate.  Also discussed rehab in terms of range of motion exercises.

## 2019-12-19 ENCOUNTER — Other Ambulatory Visit: Payer: Self-pay

## 2019-12-19 DIAGNOSIS — M25571 Pain in right ankle and joints of right foot: Secondary | ICD-10-CM

## 2019-12-26 ENCOUNTER — Encounter: Payer: Self-pay | Admitting: Orthopedic Surgery

## 2019-12-26 ENCOUNTER — Ambulatory Visit: Payer: PPO | Admitting: Orthopedic Surgery

## 2019-12-26 VITALS — Ht 71.0 in | Wt 174.0 lb

## 2019-12-26 DIAGNOSIS — M5410 Radiculopathy, site unspecified: Secondary | ICD-10-CM | POA: Diagnosis not present

## 2019-12-26 MED ORDER — PREDNISONE 10 MG PO TABS: 10.0000 mg | ORAL_TABLET | Freq: Every day | ORAL | 0 refills | Status: DC

## 2019-12-26 NOTE — Progress Notes (Signed)
Office Visit Note   Patient: Samuel MILSTEIN Sr.           Date of Birth: [birth year]           MRN: [id number] Visit Date: 12/26/2019              Requested by: [name], MD [address] [place],  [place] [address] PCP: [name], MD  Chief Complaint  Patient presents with  . Right Ankle - Pain      HPI: Patient is a 74 year old gentleman who presents for 2 separate issues.  #1 he has had some right ankle pain since February.  Patient also describes a plantar calcaneal heel spur but otherwise states that his ankle is fine he states he has been having some radicular pain along the lateral aspect of the right buttocks as well as lateral aspect of the right calf and ankle.  Patient states he has pain that is on and off sometimes at night has pain with ambulation difficulty on uneven ground.  Patient states this seems to be related to his right-sided buttocks pain.  Patient has a history of scoliosis with surgery at 74 years of age.  Patient is also status post internal fixation for a femoral neck fracture in the early 70s.  Assessment & Plan: Visit Diagnoses:  1. Radicular pain of right lower extremity     Plan: Patient's ankle seems to be asymptomatic the examination he is having radicular symptoms.  We will set him up for prednisone 10 mg with breakfast and follow-up in 4 weeks.  Plan for 2 view radiographs of the lumbar spine at follow-up.  Follow-Up Instructions: Return in about 4 weeks (around 01/23/2020).   Ortho Exam  Patient is alert, oriented, no adenopathy, well-dressed, normal affect, normal respiratory effort. Examination patient has good pulses on the right ankle he has no pain with ankle or subtalar range of motion he has a negative straight leg raise no focal motor weakness.  The calf is soft nontender no evidence of DVT no redness no cellulitis no venous stasis changes.  Imaging: No results found. No images are attached to the  encounter.  Labs: Lab Results  Component Value Date   HGBA1C 5.5 11/15/2012   GRAMSTAIN Rare 06/27/2014   GRAMSTAIN WBC present-both PMN and Mononuclear 06/27/2014   GRAMSTAIN No Organisms Seen 06/27/2014   GRAMSTAIN Few 06/27/2014   GRAMSTAIN WBC present-predominately PMN 06/27/2014   GRAMSTAIN No Organisms Seen 06/27/2014   LABORGA NO GROWTH 3 DAYS 06/27/2014   LABORGA NO GROWTH 3 DAYS 06/27/2014     Lab Results  Component Value Date   ALBUMIN 4.3 12/23/2018   ALBUMIN 4.7 12/17/2017   ALBUMIN 4.3 12/16/2016    No results found for: MG No results found for: VD25OH  No results found for: PREALBUMIN CBC EXTENDED Latest Ref Rng & Units 12/23/2018 12/17/2017 12/16/2016  WBC 3.4 - 10.8 x10E3/uL 5.0 4.9 4.4  RBC 4.14 - 5.80 x10E6/uL 4.78 5.20 4.98  HGB 13.0 - 17.7 g/dL 14.6 16.6 15.0  HCT 37.5 - 51.0 % 43.0 46.3 44.5  PLT 150 - 450 x10E3/uL 284 283 263  NEUTROABS 1 - 7 x10E3/uL 2.6 2.5 2,112  LYMPHSABS 0 - 3 x10E3/uL 1.6 1.6 1,584     Body mass index is 24.27 kg/m.  Orders:  No orders of the defined types were placed in this encounter.  Meds ordered this encounter  Medications  . predniSONE (DELTASONE) 10 MG tablet  Sig: Take 1 tablet (10 mg total) by mouth daily with breakfast.    Dispense:  30 tablet    Refill:  0     Procedures: No procedures performed  Clinical Data: No additional findings.  ROS:  All other systems negative, except as noted in the HPI. Review of Systems  Objective: Vital Signs: Ht 5\' 11"  (1.803 m)   Wt 174 lb (78.9 kg)   BMI 24.27 kg/m   Specialty Comments:  No specialty comments available.  PMFS History: Patient Active Problem List   Diagnosis Date Noted  . Hearing loss 03/30/2018  . Idiopathic scoliosis 11/15/2012  . Hyperlipidemia LDL goal <100 02/10/2009  . Essential hypertension 02/10/2009   Past Medical History:  Diagnosis Date  . Cataract    had Bil  . Cough    on and off  . Dyslipidemia   . History of colon  polyps   . HTN (hypertension)   . Hyperlipidemia   . Scoliosis     Family History  Problem Relation Age of Onset  . Heart failure Mother   . Heart attack Mother   . Heart disease Mother   . Cancer Father   . Cancer Sister   . Lung cancer Sister   . Cancer Brother   . Esophageal cancer Brother   . Coronary artery disease Brother   . Heart disease Brother   . COPD Brother     Past Surgical History:  Procedure Laterality Date  . COLONOSCOPY  2010   [facility]  . HERNIA REPAIR     right inguinal hernia  . HIP SURGERY    . right inguinal hernia    . scoliosis     x2  . TONSILLECTOMY     Social History   Occupational History  . Not on file  Tobacco Use  . Smoking status: Never Smoker  . Smokeless tobacco: Never Used  Vaping Use  . Vaping Use: Never used  Substance and Sexual Activity  . Alcohol use: No  . Drug use: No  . Sexual activity: Yes

## 2020-01-23 ENCOUNTER — Other Ambulatory Visit: Payer: Self-pay

## 2020-01-23 ENCOUNTER — Ambulatory Visit: Payer: PPO | Admitting: Physician Assistant

## 2020-01-23 ENCOUNTER — Encounter: Payer: Self-pay | Admitting: Orthopedic Surgery

## 2020-01-23 ENCOUNTER — Ambulatory Visit (INDEPENDENT_AMBULATORY_CARE_PROVIDER_SITE_OTHER): Payer: PPO

## 2020-01-23 DIAGNOSIS — M545 Low back pain, unspecified: Secondary | ICD-10-CM

## 2020-01-23 DIAGNOSIS — M5410 Radiculopathy, site unspecified: Secondary | ICD-10-CM

## 2020-01-23 NOTE — Progress Notes (Signed)
Office Visit Note   Patient: Samuel BUDHU Sr.           Date of Birth: Aug 24, 1945           MRN: [id number] Visit Date: 01/23/2020              Requested by: [name], MD [address] [place],  [place] [address] PCP: [name], MD  Chief Complaint  Patient presents with  . Right Ankle - Pain, Follow-up  . Lower Back - Pain, Follow-up      HPI: This is a pleasant gentleman who is follow-up today for his radicular pain that radiates down his right buttock into his lateral side of his leg and into his ankle. He has been on prednisone. He has occasional discomfort but otherwise this does not bother him very much. He denies any weakness or paresthesias  Assessment & Plan: Visit Diagnoses:  1. Radicular pain of right lower extremity   2. Low back pain, unspecified back pain laterality, unspecified chronicity, unspecified whether sciatica present     Plan: Patient will wean down over the next 5 days and off the prednisone. He will see how he does. Neck step if this does not improve he will contact us and we could order an MRI with follow-up for possible ESI with Dr. Ernestina Patches  Follow-Up Instructions: No follow-ups on file.   Ortho Exam  Patient is alert, oriented, no adenopathy, well-dressed, normal affect, normal respiratory effort. No acute new findings. He has good strength with dorsiflexion plantarflexion minimal to no straight leg findings nontender over the lumbar spine  Imaging: No results found. No images are attached to the encounter.  Labs: Lab Results  Component Value Date   HGBA1C 5.5 11/15/2012   GRAMSTAIN Rare 06/27/2014   GRAMSTAIN WBC present-both PMN and Mononuclear 06/27/2014   GRAMSTAIN No Organisms Seen 06/27/2014   GRAMSTAIN Few 06/27/2014   GRAMSTAIN WBC present-predominately PMN 06/27/2014   GRAMSTAIN No Organisms Seen 06/27/2014   LABORGA NO GROWTH 3 DAYS 06/27/2014   LABORGA NO GROWTH 3 DAYS 06/27/2014     Lab Results    Component Value Date   ALBUMIN 4.3 12/23/2018   ALBUMIN 4.7 12/17/2017   ALBUMIN 4.3 12/16/2016    No results found for: MG No results found for: VD25OH  No results found for: PREALBUMIN CBC EXTENDED Latest Ref Rng & Units 12/23/2018 12/17/2017 12/16/2016  WBC 3.4 - 10.8 x10E3/uL 5.0 4.9 4.4  RBC 4.14 - 5.80 x10E6/uL 4.78 5.20 4.98  HGB 13.0 - 17.7 g/dL 14.6 16.6 15.0  HCT 37.5 - 51.0 % 43.0 46.3 44.5  PLT 150 - 450 x10E3/uL 284 283 263  NEUTROABS 1 - 7 x10E3/uL 2.6 2.5 2,112  LYMPHSABS 0 - 3 x10E3/uL 1.6 1.6 1,584     There is no height or weight on file to calculate BMI.  Orders:  Orders Placed This Encounter  Procedures  . XR Lumbar Spine 2-3 Views   No orders of the defined types were placed in this encounter.    Procedures: No procedures performed  Clinical Data: No additional findings.  ROS:  All other systems negative, except as noted in the HPI. Review of Systems  Objective: Vital Signs: There were no vitals taken for this visit.  Specialty Comments:  No specialty comments available.  PMFS History: Patient Active Problem List   Diagnosis Date Noted  . Hearing loss 03/30/2018  . Idiopathic scoliosis 11/15/2012  . Hyperlipidemia LDL goal <100 02/10/2009  .  Essential hypertension 02/10/2009   Past Medical History:  Diagnosis Date  . Cataract    had Bil  . Cough    on and off  . Dyslipidemia   . History of colon polyps   . HTN (hypertension)   . Hyperlipidemia   . Scoliosis     Family History  Problem Relation Age of Onset  . Heart failure Mother   . Heart attack Mother   . Heart disease Mother   . Cancer Father   . Cancer Sister   . Lung cancer Sister   . Cancer Brother   . Esophageal cancer Brother   . Coronary artery disease Brother   . Heart disease Brother   . COPD Brother     Past Surgical History:  Procedure Laterality Date  . COLONOSCOPY  2010   peters  . HERNIA REPAIR     right inguinal hernia  . HIP SURGERY    . right  inguinal hernia    . scoliosis     x2  . TONSILLECTOMY     Social History   Occupational History  . Not on file  Tobacco Use  . Smoking status: Never Smoker  . Smokeless tobacco: Never Used  Vaping Use  . Vaping Use: Never used  Substance and Sexual Activity  . Alcohol use: No  . Drug use: No  . Sexual activity: Yes

## 2020-04-10 ENCOUNTER — Ambulatory Visit (INDEPENDENT_AMBULATORY_CARE_PROVIDER_SITE_OTHER): Payer: PPO | Admitting: Family Medicine

## 2020-04-10 ENCOUNTER — Other Ambulatory Visit: Payer: Self-pay

## 2020-04-10 ENCOUNTER — Encounter: Payer: Self-pay | Admitting: Family Medicine

## 2020-04-10 VITALS — BP 164/80 | HR 60 | Temp 97.1°F | Ht 68.0 in | Wt 173.4 lb

## 2020-04-10 DIAGNOSIS — I10 Essential (primary) hypertension: Secondary | ICD-10-CM

## 2020-04-10 DIAGNOSIS — H9113 Presbycusis, bilateral: Secondary | ICD-10-CM

## 2020-04-10 DIAGNOSIS — Z23 Encounter for immunization: Secondary | ICD-10-CM | POA: Diagnosis not present

## 2020-04-10 DIAGNOSIS — Z0000 Encounter for general adult medical examination without abnormal findings: Secondary | ICD-10-CM

## 2020-04-10 DIAGNOSIS — E785 Hyperlipidemia, unspecified: Secondary | ICD-10-CM | POA: Diagnosis not present

## 2020-04-10 DIAGNOSIS — R7309 Other abnormal glucose: Secondary | ICD-10-CM

## 2020-04-10 DIAGNOSIS — M4120 Other idiopathic scoliosis, site unspecified: Secondary | ICD-10-CM

## 2020-04-10 MED ORDER — LISINOPRIL-HYDROCHLOROTHIAZIDE 20-12.5 MG PO TABS: 1.0000 | ORAL_TABLET | Freq: Every day | ORAL | 3 refills | Status: DC

## 2020-04-10 NOTE — Progress Notes (Signed)
[name] Sr. is a 74 y.o. male who presents for annual wellness visit,CPE and follow-up on chronic medical conditions.  He has no particular concerns or complaints.  He continues on lisinopril and is also taking red yeast rice as well as other over-the-counter medications and having no difficulty with them.  He does have hearing aids but does not use them.  He has had his Covid vaccinations and is in line to get the booster.  He is semiretired now he and his wife are taking care of their daughter who has Angelman syndrome.   Immunizations and Health Maintenance Immunization History  Administered Date(s) Administered  . Fluad Quad(high Dose 65+) 05/02/2019  . Influenza Split 05/20/2011, 04/23/2012  . Influenza, High Dose Seasonal PF 04/21/2013, 05/11/2014, 03/28/2015, 05/01/2016, 05/05/2017, 04/23/2018  . Pneumococcal Conjugate-13 01/23/2014  . Pneumococcal Polysaccharide-23 07/21/2007  . Td 12/12/2014  . Tdap 12/15/2003  . Zoster 07/21/2007   Health Maintenance Due  Topic Date Due  . COVID-19 Vaccine (1) Never done  . PNA vac Low Risk Adult (2 of 2 - PPSV23) 01/24/2015  . INFLUENZA VACCINE  02/12/2020    Last colonoscopy: 02/04/19 Last PSA: 11/17/13 Dentist: Q six month Ophtho: Q year Exercise: walking 25 min 5-7 days a week  Other doctors caring for patient include: Dr. Harrington Challenger cardiology, Dr. Tarri Glenn GU, Dr. Katy Fitch eye  Advanced Directives: Does Patient Have a Medical Advance Directive?: No Would patient like information on creating a medical advance directive?: Yes (ED - Information included in AVS)  Depression screen:  See questionnaire below.     Depression screen [facility] 2/9 04/10/2020 12/20/2018 12/17/2017 12/16/2016 12/13/2015  Decreased Interest 0 0 0 0 0  Down, Depressed, Hopeless 0 0 0 0 0  PHQ - 2 Score 0 0 0 0 0    Fall Screen: See Questionaire below.   Fall Risk  04/10/2020 12/20/2018 12/17/2017 12/16/2016 12/13/2015  Falls in the past year? 0 0 No No No    ADL screen:  See  questionnaire below.  Functional Status Survey: Is the patient deaf or have difficulty hearing?: Yes (hearing aide) Does the patient have difficulty seeing, even when wearing glasses/contacts?: No Does the patient have difficulty concentrating, remembering, or making decisions?: No Does the patient have difficulty walking or climbing stairs?: No Does the patient have difficulty dressing or bathing?: No Does the patient have difficulty doing errands alone such as visiting a doctor's office or shopping?: No   Review of Systems  Constitutional: -, -unexpected weight change, -anorexia, -fatigue Allergy: -sneezing, -itching, -congestion Dermatology: denies changing moles, rash, lumps ENT: -runny nose, -ear pain, -sore throat,  Cardiology:  -chest pain, -palpitations, -orthopnea, Respiratory: -cough, -shortness of breath, -dyspnea on exertion, -wheezing,  Gastroenterology: -abdominal pain, -nausea, -vomiting, -diarrhea, -constipation, -dysphagia Hematology: -bleeding or bruising problems Musculoskeletal: -arthralgias, -myalgias, -joint swelling, -back pain, - Ophthalmology: -vision changes,  Urology: -dysuria, -difficulty urinating,  -urinary frequency, -urgency, incontinence Neurology: -, -numbness, , -memory loss, -falls, -dizziness    PHYSICAL EXAM:  General Appearance: Alert, cooperative, no distress, appears stated age.  Scoliotic changes noted in his back. Head: Normocephalic, without obvious abnormality, atraumatic Eyes: PERRL, conjunctiva/corneas clear, EOM's intact, fundi benign Ears: Normal TM's and external ear canals Nose: Nares normal, mucosa normal, no drainage or sinus   tenderness Throat: Lips, mucosa, and tongue normal; teeth and gums normal Neck: Supple, no lymphadenopathy, thyroid:no enlargement/tenderness/nodules; no carotid bruit or JVD Lungs: Clear to auscultation bilaterally without wheezes, rales or ronchi; respirations unlabored Heart: Regular rate  and rhythm,  S1 and S2 normal, no murmur, rub or gallop Abdomen: Soft, non-tender, nondistended, normoactive bowel sounds, no masses, no hepatosplenomegaly Extremities: No clubbing, cyanosis or edema Pulses: 2+ and symmetric all extremities Skin: Skin color, texture, turgor normal, no rashes or lesions Lymph nodes: Cervical, supraclavicular, and axillary nodes normal Neurologic: CNII-XII intact, normal strength, sensation and gait; reflexes 2+ and symmetric throughout   Psych: Normal mood, affect, hygiene and grooming  ASSESSMENT/PLAN: Presbycusis of both ears  Essential hypertension  Hyperlipidemia LDL goal <100  Other idiopathic scoliosis, unspecified spinal region  Needs flu shot Flu shot given today.  Lisinopril renewed.  Discussed the need for him to use his hearing aids more frequently. Immunization recommendations discussed.  Recommend he come back in October for his booster.  Colonoscopy recommendations reviewed.   Medicare Attestation I have personally reviewed: [name] medical and social history Their use of alcohol, tobacco or illicit drugs Their current medications and supplements [name] functional ability including ADLs,fall risks, home safety risks, cognitive, and hearing and visual impairment Diet and physical activities Evidence for depression or mood disorders  [name] weight, height, and BMI have been recorded in the chart.  I have made referrals, counseling, and provided education to the patient based on review of the above and I have provided the patient with a written personalized care plan for preventive services.     Jill Alexanders, MD   04/10/2020

## 2020-04-10 NOTE — Patient Instructions (Signed)
  Mr. [name] , Thank you for taking time to come for your Medicare Wellness Visit. I appreciate your ongoing commitment to your health goals. Please review the following plan we discussed and let me know if I can assist you in the future.   These are the goals we discussed: Goals   None     This is a list of the screening recommended for you and due dates:  Health Maintenance  Topic Date Due  . COVID-19 Vaccine (1) Never done  . Flu Shot  02/12/2020  . Tetanus Vaccine  12/11/2024  . Colon Cancer Screening  02/03/2029  .  Hepatitis C: One time screening is recommended by Center for Disease Control  (CDC) for  adults born from [birth year] through 1964.   Completed  . Pneumonia vaccines  Discontinued

## 2020-04-11 LAB — CBC WITH DIFFERENTIAL/PLATELET
Basophils Absolute: 0 10*3/uL (ref 0.0–0.2)
Basos: 1 %
EOS (ABSOLUTE): 0.1 10*3/uL (ref 0.0–0.4)
Eos: 3 %
Hematocrit: 44.3 % (ref 37.5–51.0)
Hemoglobin: 14.7 g/dL (ref 13.0–17.7)
Immature Grans (Abs): 0 10*3/uL (ref 0.0–0.1)
Immature Granulocytes: 0 %
Lymphocytes Absolute: 1.7 10*3/uL (ref 0.7–3.1)
Lymphs: 37 %
MCH: 30.3 pg (ref 26.6–33.0)
MCHC: 33.2 g/dL (ref 31.5–35.7)
MCV: 91 fL (ref 79–97)
Monocytes Absolute: 0.4 10*3/uL (ref 0.1–0.9)
Monocytes: 10 %
Neutrophils Absolute: 2.2 10*3/uL (ref 1.4–7.0)
Neutrophils: 49 %
Platelets: 264 10*3/uL (ref 150–450)
RBC: 4.85 x10E6/uL (ref 4.14–5.80)
RDW: 11.8 % (ref 11.6–15.4)
WBC: 4.5 10*3/uL (ref 3.4–10.8)

## 2020-04-11 LAB — LIPID PANEL
Chol/HDL Ratio: 3.6 ratio (ref 0.0–5.0)
Cholesterol, Total: 197 mg/dL (ref 100–199)
HDL: 54 mg/dL (ref >39)
LDL Chol Calc (NIH): 130 mg/dL — ABNORMAL HIGH (ref 0–99)
Triglycerides: 70 mg/dL (ref 0–149)
VLDL Cholesterol Cal: 13 mg/dL (ref 5–40)

## 2020-04-11 LAB — COMPREHENSIVE METABOLIC PANEL
ALT: 28 IU/L (ref 0–44)
AST: 25 IU/L (ref 0–40)
Albumin/Globulin Ratio: 1.6 (ref 1.2–2.2)
Albumin: 4.6 g/dL (ref 3.7–4.7)
Alkaline Phosphatase: 47 IU/L (ref 44–121)
BUN/Creatinine Ratio: 23 (ref 10–24)
BUN: 15 mg/dL (ref 8–27)
Bilirubin Total: 0.8 mg/dL (ref 0.0–1.2)
CO2: 27 mmol/L (ref 20–29)
Calcium: 9.6 mg/dL (ref 8.6–10.2)
Chloride: 98 mmol/L (ref 96–106)
Creatinine, Ser: 0.65 mg/dL — ABNORMAL LOW (ref 0.76–1.27)
GFR calc Af Amer: 112 mL/min/{1.73_m2} (ref >59)
GFR calc non Af Amer: 97 mL/min/{1.73_m2} (ref >59)
Globulin, Total: 2.8 g/dL (ref 1.5–4.5)
Glucose: 106 mg/dL — ABNORMAL HIGH (ref 65–99)
Potassium: 4.7 mmol/L (ref 3.5–5.2)
Sodium: 138 mmol/L (ref 134–144)
Total Protein: 7.4 g/dL (ref 6.0–8.5)

## 2020-04-18 ENCOUNTER — Telehealth (INDEPENDENT_AMBULATORY_CARE_PROVIDER_SITE_OTHER): Payer: PPO | Admitting: Family Medicine

## 2020-04-18 ENCOUNTER — Encounter: Payer: Self-pay | Admitting: Family Medicine

## 2020-04-18 ENCOUNTER — Other Ambulatory Visit: Payer: Self-pay

## 2020-04-18 VITALS — Ht 68.0 in | Wt 173.0 lb

## 2020-04-18 DIAGNOSIS — E7439 Other disorders of intestinal carbohydrate absorption: Secondary | ICD-10-CM

## 2020-04-18 NOTE — Progress Notes (Signed)
   Subjective:    Patient ID: [name], male    DOB: 26-Jan-1946, 74 y.o.   MRN: [id number]  HPI I connected with  TASHI BAND Sr. on 04/18/20 by a video enabled telemedicine application and verified that I am speaking with the correct person using two identifiers. [employer] used . I am in my office and he is at home I discussed the limitations of evaluation and management by telemedicine. The patient expressed understanding and agreed to proceed. He had recent blood work done which did show slightly elevated blood sugar.  A1c in follow-up was 5.9.   Review of Systems     Objective:   Physical Exam Alert and in no distress otherwise not examined       Assessment & Plan:  Glucose intolerance I discussed the diagnosis of glucose intolerance with regard to diet, exercise, possible symptoms of diabetes including polyuria, polydipsia and weight loss in spite of normal eating habits.  His weight and eating habits are fairly stable.  Recommend yearly follow-up or sooner if he has symptoms.  He was comfortable with that.

## 2020-04-19 ENCOUNTER — Ambulatory Visit (INDEPENDENT_AMBULATORY_CARE_PROVIDER_SITE_OTHER): Payer: PPO

## 2020-04-19 ENCOUNTER — Other Ambulatory Visit: Payer: Self-pay

## 2020-04-19 DIAGNOSIS — Z23 Encounter for immunization: Secondary | ICD-10-CM

## 2020-05-03 LAB — HGB A1C W/O EAG: Hgb A1c MFr Bld: 5.9 % — ABNORMAL HIGH (ref 4.8–5.6)

## 2020-05-03 LAB — SPECIMEN STATUS REPORT

## 2020-06-27 IMAGING — DX DG ANKLE COMPLETE 3+V*R*
3 series · 3 of 3 positions shown · non-contrast
Comparison: None

CLINICAL DATA: Chronic RIGHT ankle pain since August 2019, no
known injury

EXAM:
RIGHT ANKLE - COMPLETE 3+ VIEW

[dg ankle complete right (1 of 3)]
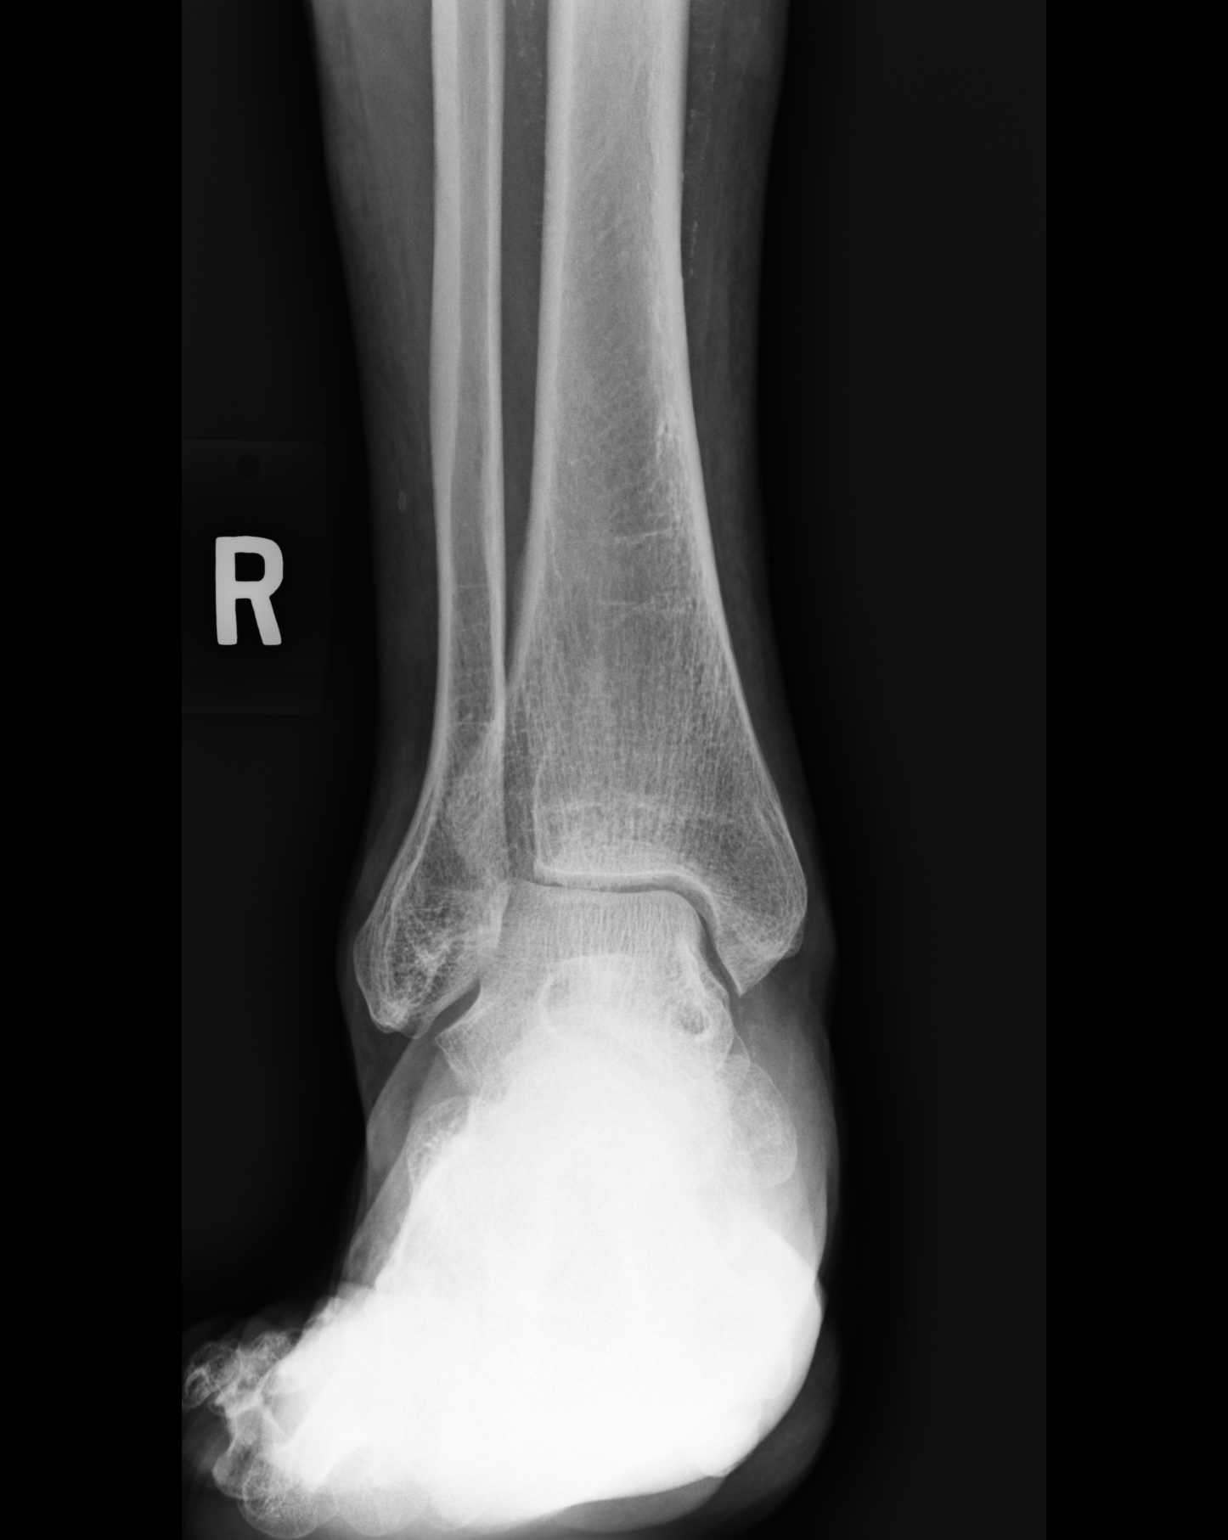

[dg ankle complete right (2 of 3)]
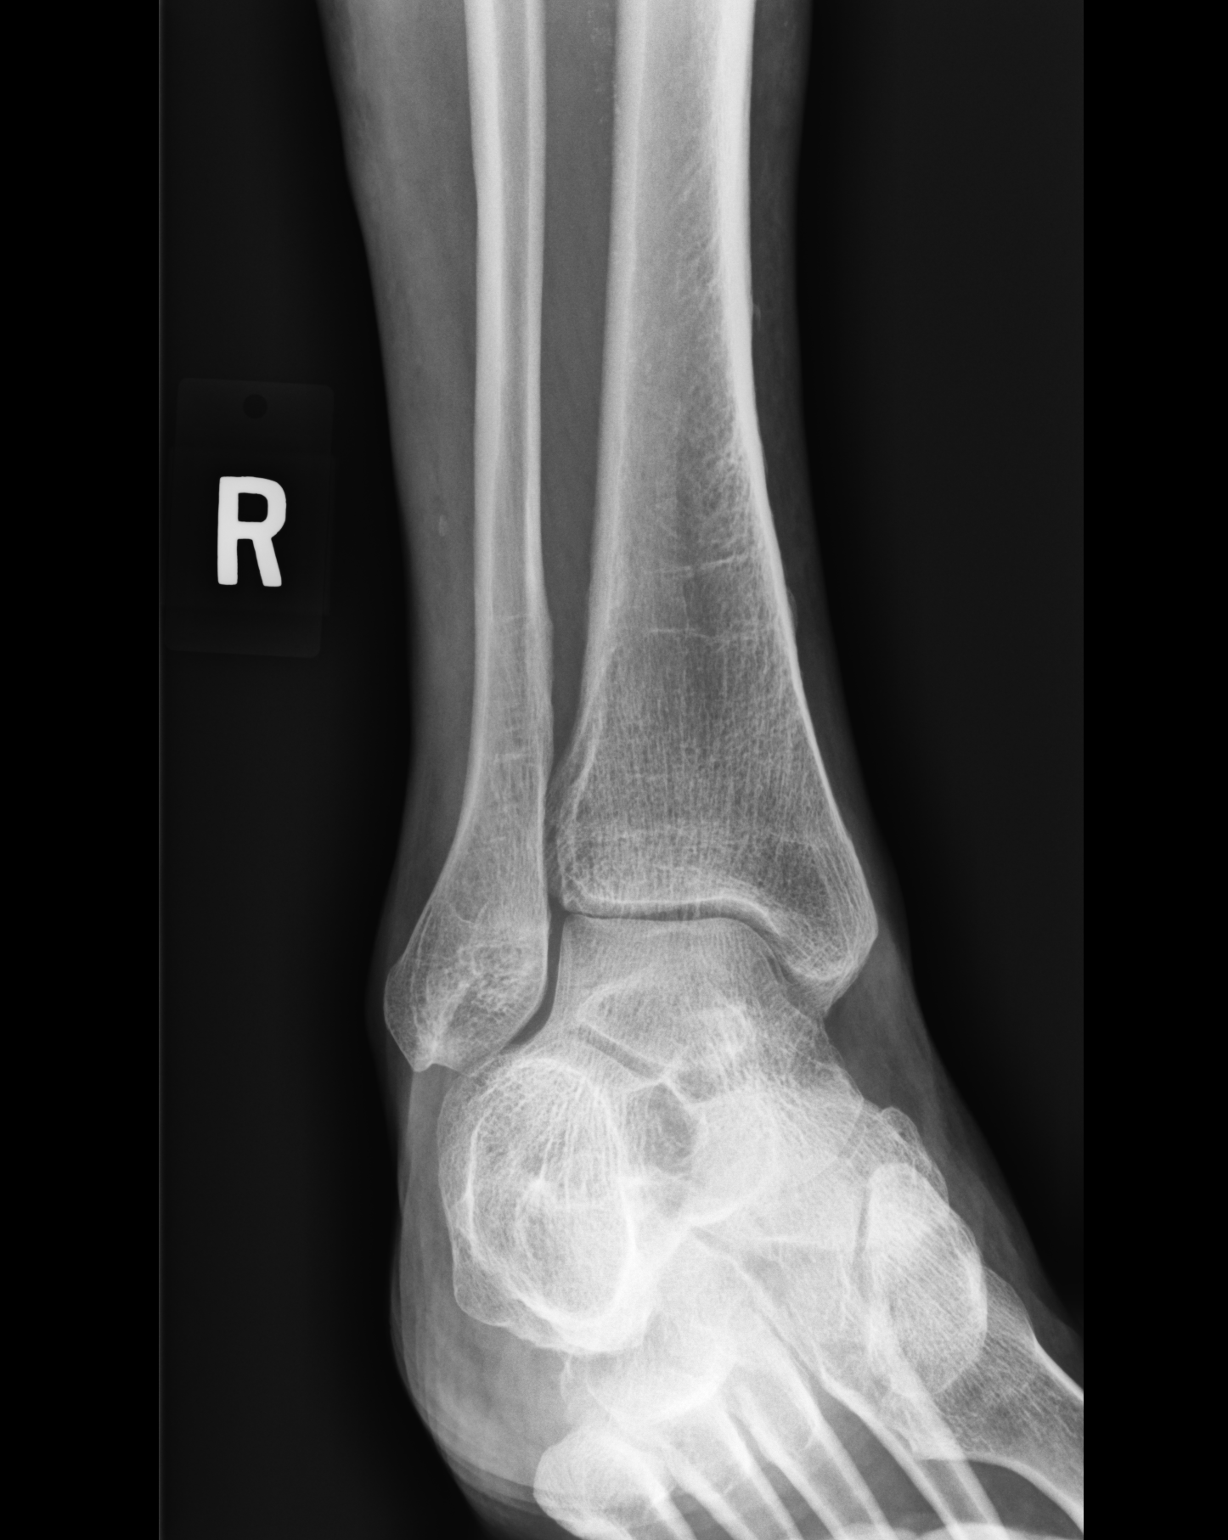

[dg ankle complete right (3 of 3)]
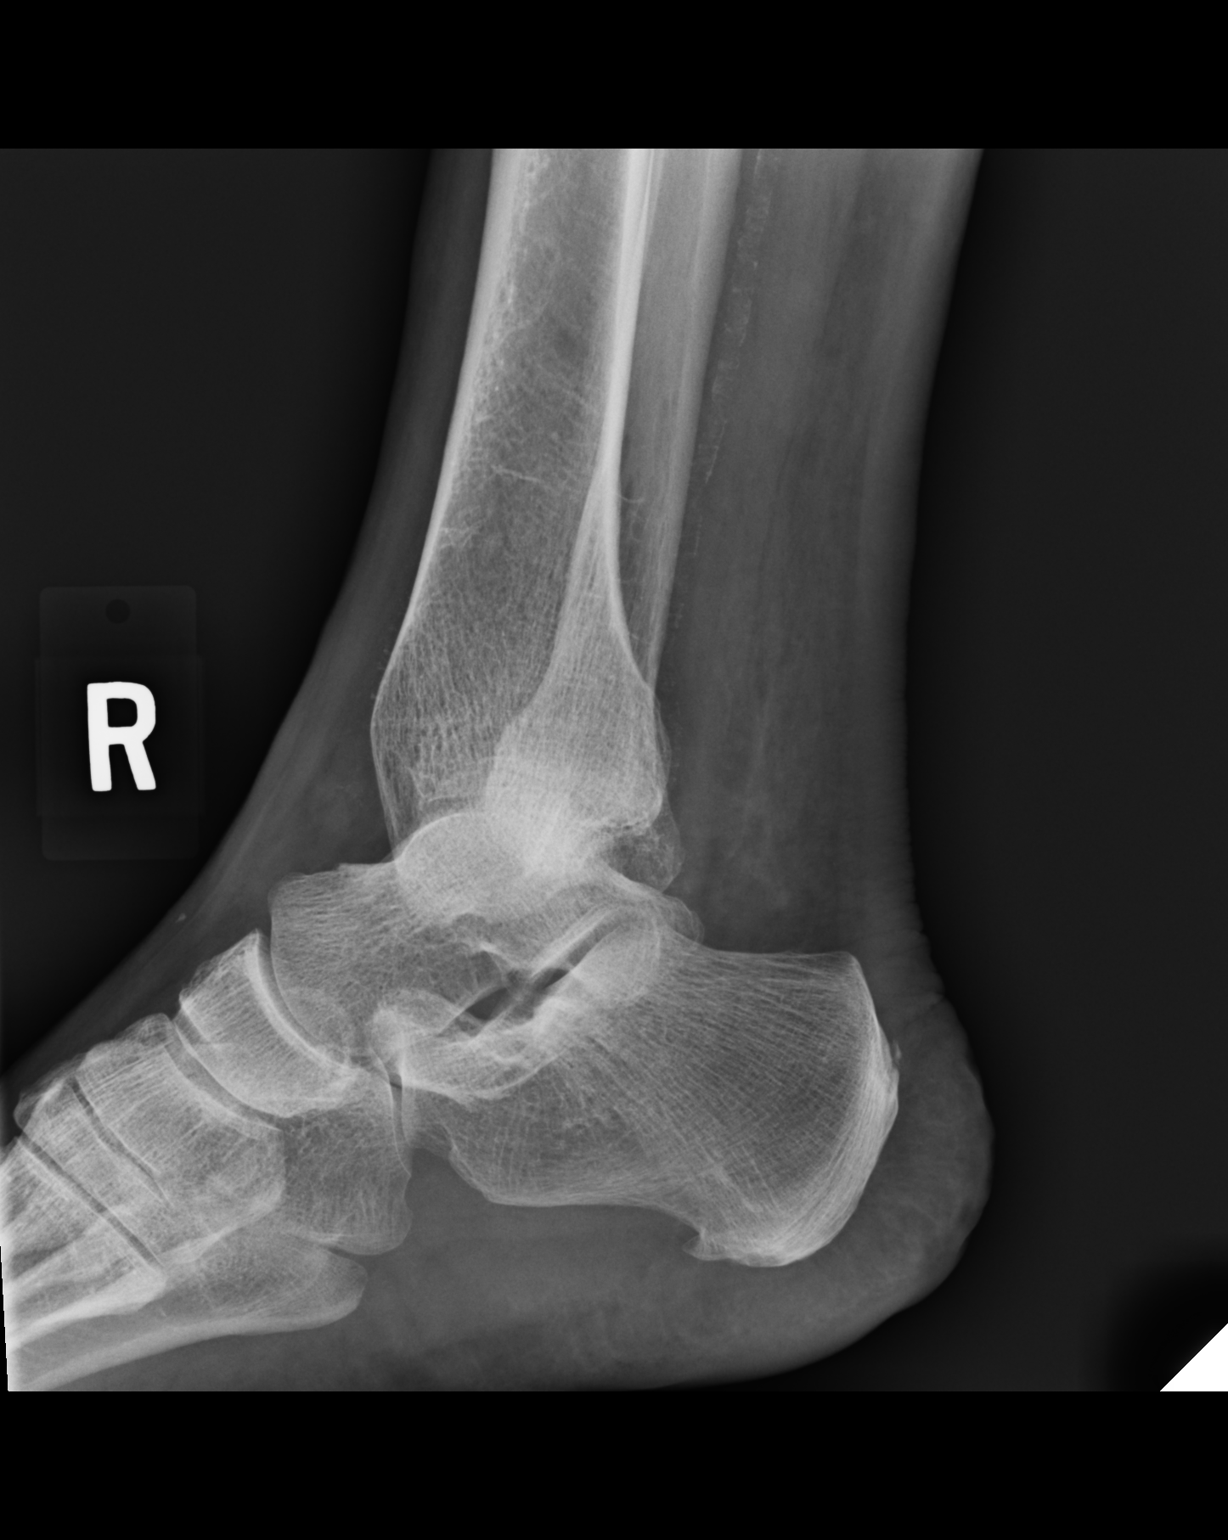

[3 of 3 positions shown; findings below may reference images not displayed]

FINDINGS: Osseous mineralization low normal.

Joint spaces preserved.

Small plantar calcaneal spur.

No acute fracture, dislocation, or bone destruction.

Atherosclerotic calcifications at the posterior tibial artery.
IMPRESSION: No acute osseous abnormalities.

Small plantar calcaneal spur.

## 2020-08-15 DIAGNOSIS — H40013 Open angle with borderline findings, low risk, bilateral: Secondary | ICD-10-CM | POA: Diagnosis not present

## 2020-08-15 DIAGNOSIS — D3132 Benign neoplasm of left choroid: Secondary | ICD-10-CM | POA: Diagnosis not present

## 2020-08-15 DIAGNOSIS — H04123 Dry eye syndrome of bilateral lacrimal glands: Secondary | ICD-10-CM | POA: Diagnosis not present

## 2020-08-15 DIAGNOSIS — H53021 Refractive amblyopia, right eye: Secondary | ICD-10-CM | POA: Diagnosis not present

## 2020-08-20 DIAGNOSIS — H409 Unspecified glaucoma: Secondary | ICD-10-CM | POA: Insufficient documentation

## 2020-11-11 NOTE — Progress Notes (Signed)
Cardiology Office Note   Date:  11/12/2020   ID:  Samuel Police Sr., DOB 1946-04-11, MRN [id number]  PCP:  [name], MD  Cardiologist:   [name], MD    Pt presents for f/u of HTN   History of Present Illness: [name]. is a 75 y.o. male with a history of HTN and HL   He was intolerant to [place]   I last saw him as a televisit in Dec 2020  Pt says he gets winded some with activities  " Im not as young as I used to be"  When brings in garbage can up drive he has to stop an rest    Did not in the past   Walks on a treadmill 4x per weeik for 25 min   Goes about 1 mile during this time   No SOB or chest tightness with this but not going fast    Patient has taken BP at home  Not lately   No dizziness      Current Meds  Medication Sig  . Ascorbic Acid (VITAMIN C) 500 MG tablet Take 500 mg by mouth daily. 2 tabs daily  . aspirin 81 MG tablet Take 81 mg by mouth daily.  Marland Kitchen co-enzyme Q-10 30 MG capsule Take 50 mg by mouth daily.  Marland Kitchen lisinopril-hydrochlorothiazide (ZESTORETIC) 20-12.5 MG tablet Take 1 tablet by mouth daily.  . Multiple Vitamin (MULTIVITAMIN) capsule Take 1 capsule by mouth daily.  . Omega-3 Fatty Acids (FISH OIL PO) Take 2 tablets by mouth daily.  Marland Kitchen PROBIOTIC CAPS Take by mouth daily.  . Psyllium (METAMUCIL PO) Take 19 g by mouth. As directed  . Red Yeast Rice POWD 600 mg 2 tabs two times per day     Allergies:   Patient has no known allergies.   Past Medical History:  Diagnosis Date  . Cataract    had Bil  . Cough    on and off  . Dyslipidemia   . History of colon polyps   . HTN (hypertension)   . Hyperlipidemia   . Scoliosis     Past Surgical History:  Procedure Laterality Date  . COLONOSCOPY  2010   [facility]  . HERNIA REPAIR     right inguinal hernia  . HIP SURGERY    . right inguinal hernia    . scoliosis     x2  . TONSILLECTOMY       Social History:  The patient  reports that he has never smoked. He has never used smokeless  tobacco. He reports that he does not drink alcohol and does not use drugs.   Family History:  [name] family history includes COPD in his brother; Cancer in his brother, father, and sister; Coronary artery disease in his brother; Esophageal cancer in his brother; Heart attack in his mother; Heart disease in his brother and mother; Heart failure in his mother; Lung cancer in his sister.    ROS:  Please see the history of present illness. All other systems are reviewed and  Negative to the above problem except as noted.    PHYSICAL EXAM: VS:  BP (!) 150/82   Pulse 62   Ht 5\' 8"  (1.727 m)   Wt 173 lb 6.4 oz (78.7 kg)   SpO2 98%   BMI 26.37 kg/m   GEN: Thin 75 yo, in no acute distress  HEENT: normal  Neck: no JVD, carotid bruits Cardiac: RRR; no murmurs,  No  LE edema  Respiratory:  clear to auscultation bilaterally, normal work of breathing GI: soft, nontender, nondistended, + BS  No hepatomegaly  MS: no deformity Moving all extremities   Skin: warm and dry, no rash Neuro:  Strength and sensation are intact Psych: euthymic mood, full affect   EKG:  EKG is ordered today.  SR 62 bpm   RBBB   Lipid Panel    Component Value Date/Time   CHOL 197 04/10/2020 1208   TRIG 70 04/10/2020 1208   HDL 54 04/10/2020 1208   CHOLHDL 3.6 04/10/2020 1208   CHOLHDL 3.3 12/16/2016 0854   VLDL 15 12/16/2016 0854   LDLCALC 130 (H) 04/10/2020 1208      Wt Readings from Last 3 Encounters:  11/12/20 173 lb 6.4 oz (78.7 kg)  04/18/20 173 lb (78.5 kg)  04/10/20 173 lb 6.4 oz (78.7 kg)      ASSESSMENT AND PLAN:  1   HTN   BP is still not optimally controlled   I would add low dose amlodipine 2.5 mg   Keep on lisinopril / HCTZ     Folllow up BP in 1 month    2  DOE   Concering    As the pt says he "is not looking for trouble" but I am concerned that his DOE is an anginal equivalent, that he is slowing down and not getting CP  He wonders if he has CAD     I would recomm a coronary CT  angiogram to define     This will guide Rx  He has a RBBB  Rate is OK .  I am not convinced that HR is causing dyspnea  3  Lipids    Reviewed   He is on red yeast rice   Watch fats and sweets     Current medicines are reviewed at length with the patient today.  The patient does not have concerns regarding medicines.  Signed, [name], MD  11/12/2020 9:54 AM    [facility] [place], [place], [place]  [address] Phone: [phone]; Fax: [phone]

## 2020-11-12 ENCOUNTER — Telehealth: Payer: Self-pay | Admitting: Family Medicine

## 2020-11-12 ENCOUNTER — Encounter: Payer: Self-pay | Admitting: Internal Medicine

## 2020-11-12 ENCOUNTER — Other Ambulatory Visit: Payer: Self-pay

## 2020-11-12 ENCOUNTER — Ambulatory Visit: Payer: PPO | Admitting: Internal Medicine

## 2020-11-12 VITALS — BP 150/82 | HR 62 | Ht 68.0 in | Wt 173.4 lb

## 2020-11-12 DIAGNOSIS — R0602 Shortness of breath: Secondary | ICD-10-CM

## 2020-11-12 DIAGNOSIS — I10 Essential (primary) hypertension: Secondary | ICD-10-CM

## 2020-11-12 MED ORDER — METOPROLOL TARTRATE 50 MG PO TABS: ORAL_TABLET | ORAL | 0 refills | Status: DC

## 2020-11-12 MED ORDER — AMLODIPINE BESYLATE 2.5 MG PO TABS: 2.5000 mg | ORAL_TABLET | Freq: Every day | ORAL | 3 refills | Status: DC

## 2020-11-12 NOTE — Patient Instructions (Addendum)
Medication Instructions:  Add Amlodipine 2.5 mg once a day  *If you need a refill on your cardiac medications before your next appointment, please call your pharmacy*   Lab Work: BMET today for CT  If you have labs (blood work) drawn today and your tests are completely normal, you will receive your results only by: Marland Kitchen MyChart Message (if you have MyChart) OR . A paper copy in the mail If you have any lab test that is abnormal or we need to change your treatment, we will call you to review the results.   Testing/Procedures: Your cardiac CT will be scheduled at one of the below locations:   [facility] [address] [place], [place] [address] [phone]  [place] [address] [place], [place] [address] [phone]  If scheduled at [facility], please arrive at the [facility] main entrance (entrance A) of [facility] 30 minutes prior to test start time. Proceed to the [facility] Radiology Department (first floor) to check-in and test prep.  If scheduled at [facility], please arrive 15 mins early for check-in and test prep.  Please follow these instructions carefully (unless otherwise directed):  Hold all erectile dysfunction medications at least 3 days (72 hrs) prior to test.  On the Night Before the Test: . Be sure to Drink plenty of water. . Do not consume any caffeinated/decaffeinated beverages or chocolate 12 hours prior to your test. . Do not take any antihistamines 12 hours prior to your test.  On the Day of the Test: . Drink plenty of water until 1 hour prior to the test. . Do not eat any food 4 hours prior to the test. . You may take your regular medications prior to the test.  . Take metoprolol (Lopressor) two hours prior to test. . HOLD Furosemide/Hydrochlorothiazide morning of the test.          After the Test: . Drink plenty of  water. . After receiving IV contrast, you may experience a mild flushed feeling. This is normal. . On occasion, you may experience a mild rash up to 24 hours after the test. This is not dangerous. If this occurs, you can take Benadryl 25 mg and increase your fluid intake. . If you experience trouble breathing, this can be serious. If it is severe call 911 IMMEDIATELY. If it is mild, please call our office. . If you take any of these medications: Glipizide/Metformin, Avandament, Glucavance, please do not take 48 hours after completing test unless otherwise instructed.   Once we have confirmed authorization from your insurance company, we will call you to set up a date and time for your test. Based on how quickly your insurance processes prior authorizations requests, please allow up to 4 weeks to be contacted for scheduling your Cardiac CT appointment. Be advised that routine Cardiac CT appointments could be scheduled as many as 8 weeks after your provider has ordered it.  For non-scheduling related questions, please contact the cardiac imaging nurse navigator should you have any questions/concerns: Marchia Bond, Cardiac Imaging Nurse Navigator Gordy Clement, Cardiac Imaging Nurse Navigator [facility] Direct Office Dial: [phone]   For scheduling needs, including cancellations and rescheduling, please call [place], [phone].     Follow-Up: In the Hypertension Clinic in one month BRING YOUR BP CUFF  At [facility], you and your health needs are our priority.  As part of our  continuing mission to provide you with exceptional heart care, we have created designated Provider Care Teams.  These Care Teams include your primary Cardiologist (physician) and Advanced Practice Providers (APPs -  Physician Assistants and Nurse Practitioners) who all work together to provide you with the care you need, when you need it.  We recommend signing up for the patient portal  called "MyChart".  Sign up information is provided on this After Visit Summary.  MyChart is used to connect with patients for Virtual Visits (Telemedicine).  Patients are able to view lab/test results, encounter notes, upcoming appointments, etc.  Non-urgent messages can be sent to your provider as well.   To learn more about what you can do with MyChart, go to [email or web address].    Your next appointment:   6 month(s)  The format for your next appointment:   In Person  Provider:   [name], MD   Other Instructions

## 2020-11-12 NOTE — Telephone Encounter (Signed)
Go ahead and start the new medication

## 2020-11-12 NOTE — Telephone Encounter (Signed)
Pts wife called and said pt went to his cardiologist today and she put him on another BP medication because his bp was 150/80. When he got home they checked it again and it was 124/76. She wanted to check with you first before he takes a new medication.

## 2020-11-12 NOTE — Telephone Encounter (Signed)
Pt. Aware.

## 2020-11-13 ENCOUNTER — Other Ambulatory Visit: Payer: Self-pay | Admitting: Internal Medicine

## 2020-11-13 LAB — BASIC METABOLIC PANEL
BUN/Creatinine Ratio: 21 (ref 10–24)
BUN: 14 mg/dL (ref 8–27)
CO2: 27 mmol/L (ref 20–29)
Calcium: 9.8 mg/dL (ref 8.6–10.2)
Chloride: 97 mmol/L (ref 96–106)
Creatinine, Ser: 0.66 mg/dL — ABNORMAL LOW (ref 0.76–1.27)
Glucose: 123 mg/dL — ABNORMAL HIGH (ref 65–99)
Potassium: 4.4 mmol/L (ref 3.5–5.2)
Sodium: 140 mmol/L (ref 134–144)
eGFR: 98 mL/min/{1.73_m2} (ref >59)

## 2020-11-20 ENCOUNTER — Telehealth (HOSPITAL_COMMUNITY): Payer: Self-pay | Admitting: Emergency Medicine

## 2020-11-20 NOTE — Telephone Encounter (Signed)
Attempted to call patient regarding upcoming cardiac CT appointment. °Left message on voicemail with name and callback number °Adelee Hannula RN Navigator Cardiac Imaging °[facility] °[phone] Office °[phone] Cell ° °

## 2020-11-22 ENCOUNTER — Other Ambulatory Visit: Payer: Self-pay

## 2020-11-22 ENCOUNTER — Ambulatory Visit (HOSPITAL_COMMUNITY)
Admission: RE | Admit: 2020-11-22 | Discharge: 2020-11-22 | Disposition: A | Discharge status: Home / Self Care | Payer: PPO | Source: Ambulatory Visit | Attending: Internal Medicine | Admitting: Internal Medicine

## 2020-11-22 DIAGNOSIS — E785 Hyperlipidemia, unspecified: Secondary | ICD-10-CM | POA: Diagnosis not present

## 2020-11-22 DIAGNOSIS — R0602 Shortness of breath: Secondary | ICD-10-CM | POA: Diagnosis not present

## 2020-11-22 DIAGNOSIS — I10 Essential (primary) hypertension: Secondary | ICD-10-CM

## 2020-11-22 DIAGNOSIS — I2510 Atherosclerotic heart disease of native coronary artery without angina pectoris: Secondary | ICD-10-CM | POA: Diagnosis not present

## 2020-11-22 MED ORDER — NITROGLYCERIN 0.4 MG SL SUBL
0.8000 mg | SUBLINGUAL_TABLET | Freq: Once | SUBLINGUAL | Status: AC
  Administered 2020-11-22: 0.8 mg via SUBLINGUAL

## 2020-11-22 MED ORDER — IOHEXOL 350 MG/ML SOLN
95.0000 mL | Freq: Once | INTRAVENOUS | Status: AC | PRN
  Administered 2020-11-22: 95 mL via INTRAVENOUS

## 2020-11-22 MED ORDER — NITROGLYCERIN 0.4 MG SL SUBL
SUBLINGUAL_TABLET | SUBLINGUAL | Status: AC
  Filled 2020-11-22: qty 2

## 2020-11-23 ENCOUNTER — Telehealth: Payer: Self-pay | Admitting: Internal Medicine

## 2020-11-23 NOTE — Telephone Encounter (Signed)
Follow Up:    This pt is returning your call from this morning(11-23-20)

## 2020-12-13 ENCOUNTER — Ambulatory Visit (INDEPENDENT_AMBULATORY_CARE_PROVIDER_SITE_OTHER): Payer: PPO | Admitting: Pharmacist

## 2020-12-13 ENCOUNTER — Other Ambulatory Visit: Payer: Self-pay

## 2020-12-13 VITALS — BP 140/78 | HR 51

## 2020-12-13 DIAGNOSIS — I10 Essential (primary) hypertension: Secondary | ICD-10-CM

## 2020-12-13 NOTE — Patient Instructions (Addendum)
It was a pleasure to meet you  Please continue taking amlodipine 2.5mg  daily and lisinopril/HCTZ 20/12.5mg  daily  Call me at [phone] with any questions  I will call you once I hear back from Dr. Harrington Challenger about the aspirin

## 2020-12-13 NOTE — Progress Notes (Signed)
Patient ID: Samuel PARMER Sr.                 DOB: [birth year]                      MRN: [id number]     HPI: Samuel SWOR Sr. is a 75 y.o. male referred by Dr. Harrington Challenger to HTN clinic. PMH is significant for HTN, HLD, prediabetes and RBBB. He was seen by Dr. Harrington Challenger on 11/12/20. Complains of increased SOB that improves with decreasing the speed of walking. CT done showed CAC score of 99 (35th percentile). Blood pressure was 150/82. HR 62. Amlodipine 2.5mg  daily was added and patient was referred to HTN clinic.  Patient presents today for follow up. Denies any issues with medications. Denies dizziness, lightheadedness, headache, blurred vision or swelling. He brings in his home BP cuff which is found to be accurate. Home cuff 140/72 vs clinic cuff 140/78. He is taking ASA 81mg  every other day.   11/12/20 Scr 0.66, K 4.4, Na 140 04/10/20 A1C 5.9  Current HTN meds: amlodipine 2.5mg  daily, lisinopril/HCTZ 20/12.5mg  daily BP goal: <130/80  Family History: [name] family history includes COPD in his brother; Cancer in his brother, father, and sister; Coronary artery disease in his brother; Esophageal cancer in his brother; Heart attack in his mother; Heart disease in his brother and mother; Heart failure in his mother; Lung cancer in his sister.   Social History: The patient  reports that he has never smoked. He has never used smokeless tobacco. He reports that he does not drink alcohol and does not use drugs  Diet: no addressed this visit  Exercise: walks on treadmill 4-5 days a week for 25 min   Home BP readings: 126/72, 112/68, 120/66, 119/76, 129/69, 110/67, 136/76, 105/59, 125/74, 95/61, 113/69, 119/69, 117/70, 116/71, 136/81, 112/65, 129/74, 117/66, 124/75, 113/68, 132/71, 111/66, 122/69 122/71 HR 48  Wt Readings from Last 3 Encounters:  11/12/20 173 lb 6.4 oz (78.7 kg)  04/18/20 173 lb (78.5 kg)  04/10/20 173 lb 6.4 oz (78.7 kg)   BP Readings from Last 3 Encounters:  11/22/20 109/60   11/12/20 (!) 150/82  04/10/20 (!) 164/80   Pulse Readings from Last 3 Encounters:  11/12/20 62  04/10/20 60  11/01/19 69    Renal function: CrCl cannot be calculated (Patient's most recent lab result is older than the maximum 21 days allowed.).  Past Medical History:  Diagnosis Date  . Cataract    had Bil  . Cough    on and off  . Dyslipidemia   . History of colon polyps   . HTN (hypertension)   . Hyperlipidemia   . Scoliosis     Current Outpatient Medications on File Prior to Visit  Medication Sig Dispense Refill  . amLODipine (NORVASC) 2.5 MG tablet Take 1 tablet (2.5 mg total) by mouth daily. 90 tablet 3  . Ascorbic Acid (VITAMIN C) 500 MG tablet Take 500 mg by mouth daily. 2 tabs daily    . aspirin 81 MG tablet Take 81 mg by mouth daily.    Marland Kitchen co-enzyme Q-10 30 MG capsule Take 50 mg by mouth daily.    Marland Kitchen lisinopril-hydrochlorothiazide (ZESTORETIC) 20-12.5 MG tablet Take 1 tablet by mouth daily. 90 tablet 3  . metoprolol tartrate (LOPRESSOR) 50 MG tablet Take one tablet (50 mg) by mouth two hours prior to your Cardiac CT. 1 tablet 0  . Multiple Vitamin (MULTIVITAMIN) capsule Take 1 capsule  by mouth daily.    . Omega-3 Fatty Acids (FISH OIL PO) Take 2 tablets by mouth daily.    Marland Kitchen PROBIOTIC CAPS Take by mouth daily.    . Psyllium (METAMUCIL PO) Take 19 g by mouth. As directed    . Red Yeast Rice POWD 600 mg 2 tabs two times per day     Current Facility-Administered Medications on File Prior to Visit  Medication Dose Route Frequency Provider Last Rate Last Admin  . influenza  inactive virus vaccine (FLUZONE/FLUARIX) injection 0.5 mL  0.5 mL Intramuscular Once [name], MD        No Known Allergies  There were no vitals taken for this visit.   Assessment/Plan:  1. Hypertension - Blood pressure is above goal of <130/80 in clinic today. However, his home readings are at goal. Home meter found accurate and technique correct. Continue amlodipine 2.5mg  daily and  lisinopril/HCTZ 20/12.5mg  daily. Follow up as needed.  2. ASA - Patient has been taking ASA 81mg  every other day for many years. We talked about the change in guidelines for primary prevention and suggested that his bleed risk may be greater than his CVRR. I do not feel his CAC is high enough to classify secondary prevention, therefore primary prevention guidelines would recommend against. I will discuss with Dr. Harrington Challenger and let him know what we decide.   Thank you  Ramond Dial, Pharm.D, BCPS, CPP [place]  [address] [place], [place] [address]  Phone: [phone]; Fax: [phone]

## 2020-12-14 ENCOUNTER — Telehealth: Payer: Self-pay | Admitting: Pharmacist

## 2020-12-14 NOTE — Telephone Encounter (Signed)
Left message for patient to call back. Spoke with Dr. Harrington Challenger who states to have patient continue ASA 81mg  every other day. CAC score close to 100.

## 2020-12-14 NOTE — Progress Notes (Signed)
Reviewed with Hughie Closs.   With calcium score 99 keep on ASA every other day

## 2020-12-17 NOTE — Telephone Encounter (Signed)
Pt returned call to clinic, advised him of message to continue ASA 81mg  QOD. He verbalized understanding.

## 2021-04-04 ENCOUNTER — Other Ambulatory Visit: Payer: Self-pay | Admitting: Family Medicine

## 2021-04-04 DIAGNOSIS — I10 Essential (primary) hypertension: Secondary | ICD-10-CM

## 2021-04-11 ENCOUNTER — Other Ambulatory Visit: Payer: Self-pay | Admitting: Family Medicine

## 2021-04-11 ENCOUNTER — Other Ambulatory Visit: Payer: Self-pay

## 2021-04-11 ENCOUNTER — Ambulatory Visit (INDEPENDENT_AMBULATORY_CARE_PROVIDER_SITE_OTHER): Payer: PPO | Admitting: Family Medicine

## 2021-04-11 ENCOUNTER — Encounter: Payer: Self-pay | Admitting: Family Medicine

## 2021-04-11 VITALS — BP 112/72 | HR 63 | Temp 97.7°F | Ht 68.5 in | Wt 170.2 lb

## 2021-04-11 DIAGNOSIS — E7439 Other disorders of intestinal carbohydrate absorption: Secondary | ICD-10-CM | POA: Diagnosis not present

## 2021-04-11 DIAGNOSIS — R7309 Other abnormal glucose: Secondary | ICD-10-CM | POA: Diagnosis not present

## 2021-04-11 DIAGNOSIS — H409 Unspecified glaucoma: Secondary | ICD-10-CM | POA: Diagnosis not present

## 2021-04-11 DIAGNOSIS — M4120 Other idiopathic scoliosis, site unspecified: Secondary | ICD-10-CM | POA: Diagnosis not present

## 2021-04-11 DIAGNOSIS — Z23 Encounter for immunization: Secondary | ICD-10-CM

## 2021-04-11 DIAGNOSIS — N529 Male erectile dysfunction, unspecified: Secondary | ICD-10-CM

## 2021-04-11 DIAGNOSIS — E785 Hyperlipidemia, unspecified: Secondary | ICD-10-CM | POA: Diagnosis not present

## 2021-04-11 DIAGNOSIS — I10 Essential (primary) hypertension: Secondary | ICD-10-CM | POA: Diagnosis not present

## 2021-04-11 DIAGNOSIS — Z0000 Encounter for general adult medical examination without abnormal findings: Secondary | ICD-10-CM | POA: Diagnosis not present

## 2021-04-11 DIAGNOSIS — Z789 Other specified health status: Secondary | ICD-10-CM

## 2021-04-11 DIAGNOSIS — H9113 Presbycusis, bilateral: Secondary | ICD-10-CM | POA: Diagnosis not present

## 2021-04-11 MED ORDER — TADALAFIL 20 MG PO TABS: 20.0000 mg | ORAL_TABLET | Freq: Every day | ORAL | 5 refills | Status: DC | PRN

## 2021-04-11 NOTE — Progress Notes (Signed)
Samuel IVERY Sr. is a 75 y.o. male who presents for annual wellness visit,CPE and follow-up on chronic medical conditions.  He has had difficulty getting and maintaining an erection otherwise he has no particular concerns or complaints.  He continues on lisinopril/HCTZ and amlodipine.  He has had no difficulty with that.  He is statin intolerant and presently is using red yeast rice and omega-3.  He continues on 1 aspirin per day.  He has seen cardiology in the past.  He is now semiretired.  He and his wife take care of their daughter who has Angelman syndrome.  He does use hearing aids.  He also has glaucoma and does follow-up with ophthalmology regularly.  Also has a history of glucose intolerance.   Immunizations and Health Maintenance Immunization History  Administered Date(s) Administered   Fluad Quad(high Dose 65+) 05/02/2019, 04/10/2020   Influenza Split 05/20/2011, 04/23/2012   Influenza, High Dose Seasonal PF 04/21/2013, 05/11/2014, 03/28/2015, 05/01/2016, 05/05/2017, 04/23/2018   PFIZER(Purple Top)SARS-COV-2 Vaccination 09/21/2019, 10/19/2019, 04/19/2020   Pneumococcal Conjugate-13 01/23/2014   Pneumococcal Polysaccharide-23 07/21/2007   Td 12/12/2014   Tdap 12/15/2003   Zoster Recombinat (Shingrix) 03/07/2019, 05/09/2019   Zoster, Live 07/21/2007   Health Maintenance Due  Topic Date Due   COVID-19 Vaccine (4 - Booster for [place] series) 08/20/2020   INFLUENZA VACCINE  02/11/2021    Last colonoscopy:2020 Last PSA:N/A Dentist: Ophtho: Exercise: Normal daily activities  Other doctors caring for patient include:Ross:card  Advanced Directives: Does Patient Have a Medical Advance Directive?: No Would patient like information on creating a medical advance directive?: Yes (ED - Information included in AVS)  Depression screen:  See questionnaire below.     Depression screen [facility] 2/9 04/11/2021 04/10/2020 12/20/2018 12/17/2017 12/16/2016  Decreased Interest 0 0 0 0 0  Down,  Depressed, Hopeless 0 0 0 0 0  PHQ - 2 Score 0 0 0 0 0    Fall Screen: See Questionaire below.   Fall Risk  04/11/2021 04/10/2020 12/20/2018 12/17/2017 12/16/2016  Falls in the past year? 0 0 0 No No  Number falls in past yr: 0 - - - -  Injury with Fall? 0 - - - -  Risk for fall due to : No Fall Risks - - - -  Follow up Falls evaluation completed - - - -    ADL screen:  See questionnaire below.  Functional Status Survey: Is the patient deaf or have difficulty hearing?: Yes (has hearing devices) Does the patient have difficulty seeing, even when wearing glasses/contacts?: No Does the patient have difficulty concentrating, remembering, or making decisions?: No Does the patient have difficulty walking or climbing stairs?: No Does the patient have difficulty dressing or bathing?: No Does the patient have difficulty doing errands alone such as visiting a doctor's office or shopping?: No   Review of Systems  Constitutional: -, -unexpected weight change, -anorexia, -fatigue Allergy: -sneezing, -itching, -congestion Dermatology: denies changing moles, rash, lumps ENT: -runny nose, -ear pain, -sore throat,  Cardiology:  -chest pain, -palpitations, -orthopnea, Respiratory: -cough, -shortness of breath, -dyspnea on exertion, -wheezing,  Gastroenterology: -abdominal pain, -nausea, -vomiting, -diarrhea, -constipation, -dysphagia Hematology: -bleeding or bruising problems Musculoskeletal: -arthralgias, -myalgias, -joint swelling, -back pain, - Ophthalmology: -vision changes,  Urology: -dysuria, -difficulty urinating,  -urinary frequency, -urgency, incontinence Neurology: -, -numbness, , -memory loss, -falls, -dizziness    PHYSICAL EXAM:  BP 112/72   Pulse 63   Temp 97.7 F (36.5 C)   Ht 5' 8.5" (1.74 m)  Wt 170 lb 3.2 oz (77.2 kg)   SpO2 97%   BMI 25.50 kg/m   General Appearance: Alert, cooperative, no distress, appears stated age Head: Normocephalic, without obvious abnormality,  atraumatic Eyes: PERRL, conjunctiva/corneas clear, EOM's intact,  Ears: Normal TM's and external ear canals Nose: Nares normal, mucosa normal, no drainage or sinus   tenderness Throat: Lips, mucosa, and tongue normal; teeth and gums normal Neck: Supple, no lymphadenopathy, thyroid:no enlargement/tenderness/nodules; no carotid bruit or JVD Lungs: Clear to auscultation bilaterally without wheezes, rales or ronchi; respirations unlabored Heart: Regular rate and rhythm, S1 and S2 normal, no murmur, rub or gallop Abdomen: Soft, non-tender, nondistended, normoactive bowel sounds, no masses, no hepatosplenomegaly Extremities: No clubbing, cyanosis or edema Pulses: 2+ and symmetric all extremities Skin: Skin color, texture, turgor normal, no rashes or lesions Lymph nodes: Cervical, supraclavicular, and axillary nodes normal Neurologic: CNII-XII intact, normal strength, sensation and gait; reflexes 2+ and symmetric throughout   Psych: Normal mood, affect, hygiene and grooming Scoliotic changes noted to his back. ASSESSMENT/PLAN: Routine general medical examination at a health care facility - Plan: CBC with Differential/Platelet, Comprehensive metabolic panel, Lipid panel  Presbycusis of both ears  Glucose intolerance - Plan: Comprehensive metabolic panel  Essential hypertension - Plan: CBC with Differential/Platelet, Comprehensive metabolic panel  Other idiopathic scoliosis, unspecified spinal region  Hyperlipidemia LDL goal <100 - Plan: Lipid panel  Glaucoma, unspecified glaucoma type, unspecified laterality  Need for influenza vaccination - Plan: Flu Vaccine QUAD High Dose(Fluad)  Need for COVID-19 vaccine - Plan: Pfizer Covid-19 Vaccine Bivalent Booster  Statin intolerance We will continue on his present medication regimen.  Follow-up with cardiology as needed.  Discussed use of Cialis and possible side effects.    Immunization recommendations discussed.  Colonoscopy recommendations  reviewed.   Medicare Attestation I have personally reviewed: [name] medical and social history Their use of alcohol, tobacco or illicit drugs Their current medications and supplements [name] functional ability including ADLs,fall risks, home safety risks, cognitive, and hearing and visual impairment Diet and physical activities Evidence for depression or mood disorders  [name] weight, height, and BMI have been recorded in the chart.  I have made referrals, counseling, and provided education to the patient based on review of the above and I have provided the patient with a written personalized care plan for preventive services.     Jill Alexanders, MD   04/11/2021

## 2021-04-12 LAB — CBC WITH DIFFERENTIAL/PLATELET
Basophils Absolute: 0 10*3/uL (ref 0.0–0.2)
Basos: 1 %
EOS (ABSOLUTE): 0.2 10*3/uL (ref 0.0–0.4)
Eos: 3 %
Hematocrit: 44.4 % (ref 37.5–51.0)
Hemoglobin: 14.9 g/dL (ref 13.0–17.7)
Immature Grans (Abs): 0 10*3/uL (ref 0.0–0.1)
Immature Granulocytes: 0 %
Lymphocytes Absolute: 1.7 10*3/uL (ref 0.7–3.1)
Lymphs: 33 %
MCH: 30.2 pg (ref 26.6–33.0)
MCHC: 33.6 g/dL (ref 31.5–35.7)
MCV: 90 fL (ref 79–97)
Monocytes Absolute: 0.5 10*3/uL (ref 0.1–0.9)
Monocytes: 10 %
Neutrophils Absolute: 2.8 10*3/uL (ref 1.4–7.0)
Neutrophils: 53 %
Platelets: 276 10*3/uL (ref 150–450)
RBC: 4.94 x10E6/uL (ref 4.14–5.80)
RDW: 11.8 % (ref 11.6–15.4)
WBC: 5.3 10*3/uL (ref 3.4–10.8)

## 2021-04-12 LAB — COMPREHENSIVE METABOLIC PANEL
ALT: 20 IU/L (ref 0–44)
AST: 22 IU/L (ref 0–40)
Albumin/Globulin Ratio: 1.9 (ref 1.2–2.2)
Albumin: 4.8 g/dL — ABNORMAL HIGH (ref 3.7–4.7)
Alkaline Phosphatase: 52 IU/L (ref 44–121)
BUN/Creatinine Ratio: 20 (ref 10–24)
BUN: 14 mg/dL (ref 8–27)
Bilirubin Total: 0.6 mg/dL (ref 0.0–1.2)
CO2: 30 mmol/L — ABNORMAL HIGH (ref 20–29)
Calcium: 10 mg/dL (ref 8.6–10.2)
Chloride: 98 mmol/L (ref 96–106)
Creatinine, Ser: 0.7 mg/dL — ABNORMAL LOW (ref 0.76–1.27)
Globulin, Total: 2.5 g/dL (ref 1.5–4.5)
Glucose: 116 mg/dL — ABNORMAL HIGH (ref 70–99)
Potassium: 5.2 mmol/L (ref 3.5–5.2)
Sodium: 141 mmol/L (ref 134–144)
Total Protein: 7.3 g/dL (ref 6.0–8.5)
eGFR: 97 mL/min/{1.73_m2} (ref >59)

## 2021-04-12 LAB — LIPID PANEL
Chol/HDL Ratio: 3.6 ratio (ref 0.0–5.0)
Cholesterol, Total: 199 mg/dL (ref 100–199)
HDL: 55 mg/dL (ref >39)
LDL Chol Calc (NIH): 126 mg/dL — ABNORMAL HIGH (ref 0–99)
Triglycerides: 99 mg/dL (ref 0–149)
VLDL Cholesterol Cal: 18 mg/dL (ref 5–40)

## 2021-04-15 LAB — SPECIMEN STATUS REPORT

## 2021-04-15 LAB — HGB A1C W/O EAG: Hgb A1c MFr Bld: 5.9 % — ABNORMAL HIGH (ref 4.8–5.6)

## 2021-05-21 ENCOUNTER — Other Ambulatory Visit: Payer: PPO

## 2021-06-24 ENCOUNTER — Other Ambulatory Visit: Payer: Self-pay | Admitting: Family Medicine

## 2021-06-24 DIAGNOSIS — I10 Essential (primary) hypertension: Secondary | ICD-10-CM

## 2021-08-16 DIAGNOSIS — H04123 Dry eye syndrome of bilateral lacrimal glands: Secondary | ICD-10-CM | POA: Diagnosis not present

## 2021-08-16 DIAGNOSIS — H40013 Open angle with borderline findings, low risk, bilateral: Secondary | ICD-10-CM | POA: Diagnosis not present

## 2021-08-16 DIAGNOSIS — H43811 Vitreous degeneration, right eye: Secondary | ICD-10-CM | POA: Diagnosis not present

## 2021-08-16 DIAGNOSIS — D3132 Benign neoplasm of left choroid: Secondary | ICD-10-CM | POA: Diagnosis not present

## 2021-08-16 DIAGNOSIS — Z961 Presence of intraocular lens: Secondary | ICD-10-CM | POA: Diagnosis not present

## 2021-08-16 DIAGNOSIS — H53021 Refractive amblyopia, right eye: Secondary | ICD-10-CM | POA: Diagnosis not present

## 2021-10-11 ENCOUNTER — Other Ambulatory Visit: Payer: Self-pay | Admitting: Internal Medicine

## 2021-10-18 ENCOUNTER — Other Ambulatory Visit: Payer: Self-pay | Admitting: Family Medicine

## 2021-10-18 DIAGNOSIS — I10 Essential (primary) hypertension: Secondary | ICD-10-CM

## 2021-10-28 ENCOUNTER — Ambulatory Visit (INDEPENDENT_AMBULATORY_CARE_PROVIDER_SITE_OTHER): Payer: PPO | Admitting: Family Medicine

## 2021-10-28 ENCOUNTER — Encounter: Payer: Self-pay | Admitting: Family Medicine

## 2021-10-28 VITALS — BP 138/84 | HR 66 | Temp 97.5°F | Wt 175.8 lb

## 2021-10-28 DIAGNOSIS — R062 Wheezing: Secondary | ICD-10-CM | POA: Diagnosis not present

## 2021-10-28 DIAGNOSIS — J069 Acute upper respiratory infection, unspecified: Secondary | ICD-10-CM | POA: Diagnosis not present

## 2021-10-28 MED ORDER — ALBUTEROL SULFATE HFA 108 (90 BASE) MCG/ACT IN AERS: 2.0000 | INHALATION_SPRAY | Freq: Four times a day (QID) | RESPIRATORY_TRACT | 0 refills | Status: DC | PRN

## 2021-10-28 MED ORDER — AMOXICILLIN 875 MG PO TABS: 875.0000 mg | ORAL_TABLET | Freq: Two times a day (BID) | ORAL | 0 refills | Status: DC

## 2021-10-28 NOTE — Progress Notes (Signed)
? ?  Subjective:  ? ? Patient ID: Samuel RENNERT Sr., male    DOB: [birth year], 76 y.o.   MRN: [id number] ? ?HPI ?He states that Saturday he developed a slight cough with some postnasal drainage but no sneezing, itchy watery eyes, rhinorrhea.  On Friday the coughing got much worse but again no fever, chills, sore throat, earache. ? ? ?Review of Systems ? ?   ?Objective:  ? Physical Exam ?Alert and in no distress. Tympanic membranes and canals are normal. Pharyngeal area is normal. Neck is supple without adenopathy or thyromegaly. Cardiac exam shows a regular sinus rhythm without murmurs or gallops. Lungs show expiratory wheezing. ? ? ? ? ?   ?Assessment & Plan:  ?Wheezing - Plan: albuterol (VENTOLIN HFA) 108 (90 Base) MCG/ACT inhaler ? ?URI with cough and congestion - Plan: amoxicillin (AMOXIL) 875 MG tablet ?Go ahead and use the inhaler as directed.  I will call in the antibiotic but give it a couple of days to see if you turn the corner without needing it then if you do you will have it for your vacation ? ?

## 2021-10-28 NOTE — Patient Instructions (Signed)
Go ahead and use the inhaler as directed.  I will call in the antibiotic but give it a couple of days to see if you turn the corner without needing it then if you do you will have it for your vacation ?

## 2022-01-04 ENCOUNTER — Other Ambulatory Visit: Payer: Self-pay | Admitting: Internal Medicine

## 2022-02-21 ENCOUNTER — Encounter: Payer: Self-pay | Admitting: Internal Medicine

## 2022-02-21 ENCOUNTER — Other Ambulatory Visit: Payer: Self-pay

## 2022-02-21 ENCOUNTER — Other Ambulatory Visit: Payer: Self-pay | Admitting: Internal Medicine

## 2022-02-21 MED ORDER — AMLODIPINE BESYLATE 2.5 MG PO TABS: ORAL_TABLET | ORAL | 0 refills | Status: DC

## 2022-02-21 NOTE — Telephone Encounter (Signed)
error 

## 2022-03-19 ENCOUNTER — Encounter: Payer: Self-pay | Admitting: Internal Medicine

## 2022-04-01 NOTE — Progress Notes (Unsigned)
Cardiology Office Note   Date:  04/02/2022   ID:  Samuel EASTERLY Sr., DOB 25-Jun-1946, MRN [id number]  PCP:  [name], MD  Cardiologist:   [name], MD   Pt presents for follow up of CAD, lipids and HTN    History of Present Illness: Samuel KAUFHOLD Sr. is a 76 y.o. male with a history of HTN and HL    I saw hiim back in 2022.  At that time he felt he had some increased SOB with activity   Set up for a CT coronary angiogram  (see below)    CT showed no Ca score of 99.1.     Since seen he has stayed active   Denies CP  Breathing is OK      Diet: Breakfast:at 10:30  eggs, sausage, biscuit, hamburger biscuit Lunch  no Dinner:   grilled port chops, green beans and potatoes  Water, occas coke Snacks;   cookie in morning      Night:  :  ice cream, water mellon   Current Meds  Medication Sig   Acetaminophen (TYLENOL 8 HOUR PO) Take by mouth.   albuterol (VENTOLIN HFA) 108 (90 Base) MCG/ACT inhaler Inhale 2 puffs into the lungs every 6 (six) hours as needed for wheezing or shortness of breath.   amLODipine (NORVASC) 2.5 MG tablet TAKE 1 TABLET(2.5 MG) BY MOUTH DAILY   amoxicillin (AMOXIL) 875 MG tablet Take 1 tablet (875 mg total) by mouth 2 (two) times daily.   Ascorbic Acid (VITAMIN C) 500 MG tablet Take 1,000 mg by mouth daily.   aspirin 81 MG tablet Take 81 mg by mouth every other day.   co-enzyme Q-10 30 MG capsule Take 200 mg by mouth daily.   guaiFENesin (MUCINEX) 600 MG 12 hr tablet Take by mouth 2 (two) times daily.   lisinopril-hydrochlorothiazide (ZESTORETIC) 20-12.5 MG tablet TAKE 1 TABLET BY MOUTH DAILY   Multiple Vitamin (MULTIVITAMIN) capsule Take 1 capsule by mouth daily.   Omega-3 Fatty Acids (FISH OIL PO) Take 2 tablets by mouth daily.   PROBIOTIC CAPS Take by mouth daily.   Psyllium (METAMUCIL PO) Take 19 g by mouth. As directed   Red Yeast Rice POWD 600 mg 2 tabs two times per day   tadalafil (CIALIS) 20 MG tablet Take 1 tablet (20 mg total) by mouth  daily as needed for erectile dysfunction.     Allergies:   Patient has no known allergies.   Past Medical History:  Diagnosis Date   Cataract    had Bil   Cough    on and off   Dyslipidemia    History of colon polyps    HTN (hypertension)    Hyperlipidemia    Scoliosis     Past Surgical History:  Procedure Laterality Date   COLONOSCOPY  2010   peters   HERNIA REPAIR     right inguinal hernia   HIP SURGERY     right inguinal hernia     scoliosis     x2   TONSILLECTOMY       Social History:  The patient  reports that he has never smoked. He has never used smokeless tobacco. He reports that he does not drink alcohol and does not use drugs.   Family History:  [name] family history includes COPD in his brother; Cancer in his brother, father, and sister; Coronary artery disease in his brother; Esophageal cancer in his brother; Heart attack in his  mother; Heart disease in his brother and mother; Heart failure in his mother; Lung cancer in his sister.    ROS:  Please see the history of present illness. All other systems are reviewed and  Negative to the above problem except as noted.    PHYSICAL EXAM: VS:  BP (!) 160/80   Pulse (!) 56   Ht 5' 8.5" (1.74 m)   Wt 176 lb (79.8 kg)   SpO2 96%   BMI 26.37 kg/m    HYPERTENSION CONTROL Vitals:   04/02/22 0939 04/02/22 1815  BP: (!) 140/80 (!) 160/80    [name] blood pressure is elevated above target today.  In order to address the patient's elevated BP: Follow up with primary care provider for management.      GEN: Well nourished, well developed, in no acute distress  HEENT: normal  Neck: no JVD, carotid bruits, or masses Cardiac: RRR; no murmurs, rubs, or gallops,no edema  Respiratory:  clear to auscultation bilaterally, normal work of breathing GI: soft, nontender, nondistended, + BS  No hepatomegaly  MS: no deformity Moving all extremities   Skin: warm and dry, no rash Neuro:  Strength and  sensation are intact Psych: euthymic mood, full affect   EKG:  EKG is ordered today.  Sinus bradycardia   56 bpm   RBBB  Coronary CT angiogram   2022  Aorta: Normal size. Ascending aorta 3.0 cm. No calcifications. No dissection.   Aortic Valve:  Trileaflet.  No calcifications.   Coronary Arteries:  Normal coronary origin.  Right dominance.   RCA is a large dominant artery that gives rise to PDA and PLVB. There is no plaque.   Left main is a large artery that gives rise to LAD, LCX, and RI arteries.   LAD is a large vessel that has minimal (<25%) calcified plaque. There are two tiny diagonal vessels without plaque.   LCX is a non-dominant artery that gives rise to one large OM1 branch. There is minimal (<25%) calcified plaque in the LCX proximally. There is a tiny OM1 and large OM2 without plaque.   RI is a tiny vessel without plaque.   Coronary Calcium Score:   Left main: 0   Left anterior descending artery: 37.2   Left circumflex artery: 61.9   Right coronary artery: 0   Total: 99.1   Percentile: 35th   Other findings:   Normal pulmonary vein drainage into the left atrium.   Normal let atrial appendage without a thrombus.   Normal size of the pulmonary artery.   IMPRESSION: 1. Coronary calcium score of 99.1 This was 35th percentile for age-, race-, and sex-matched controls.   2. Normal coronary origin with right dominance.   3. There is minimal (<25%) plaque in the LAD and LCX.  CAD-RADS 1.   4.  Consider non-cardiac causes of exertional dyspnea.   Lipid Panel    Component Value Date/Time   CHOL 199 04/11/2021 1150   TRIG 99 04/11/2021 1150   HDL 55 04/11/2021 1150   CHOLHDL 3.6 04/11/2021 1150   CHOLHDL 3.3 12/16/2016 0854   VLDL 15 12/16/2016 0854   LDLCALC 126 (H) 04/11/2021 1150      Wt Readings from Last 3 Encounters:  04/02/22 176 lb (79.8 kg)  10/28/21 175 lb 12.8 oz (79.7 kg)  04/11/21 170 lb 3.2 oz (77.2 kg)      ASSESSMENT  AND PLAN:  1  HTN  BP is not optimally controlled   Higher on  my check  ? Nerves   Pt says he gets anxious   Recomm he take BP cuff to Dr Sydnee Cabal appt to see if comparaable   Check BP more at home   He is not on many meds     2   CAD   Mild on CT scan   No symptoms   Risk factor modify  3  HL    Lipids need to be rechecked   he is on red yeast rice    Will have soon   I will follow up with him once I see the results   4  Diet   Cut back on processed foods and sugars   Increase veggies    Reviewed with patient     Current medicines are reviewed at length with the patient today.  The patient does not have concerns regarding medicines.  Signed, [name], MD  04/02/2022 [facility] [place], [place], [place]  [address] Phone: [phone]; Fax: [phone]

## 2022-04-02 ENCOUNTER — Ambulatory Visit: Payer: PPO | Attending: Internal Medicine | Admitting: Internal Medicine

## 2022-04-02 ENCOUNTER — Encounter: Payer: Self-pay | Admitting: Internal Medicine

## 2022-04-02 VITALS — BP 160/80 | HR 56 | Ht 68.5 in | Wt 176.0 lb

## 2022-04-02 DIAGNOSIS — I10 Essential (primary) hypertension: Secondary | ICD-10-CM

## 2022-04-02 NOTE — Patient Instructions (Signed)
Medication Instructions:   *If you need a refill on your cardiac medications before your next appointment, please call your pharmacy*   Lab Work:  If you have labs (blood work) drawn today and your tests are completely normal, you will receive your results only by: MyChart Message (if you have MyChart) OR A paper copy in the mail If you have any lab test that is abnormal or we need to change your treatment, we will call you to review the results.   Testing/Procedures:    Follow-Up: At  [facility]Care, you and your health needs are our priority.  As part of our continuing mission to provide you with exceptional heart care, we have created designated Provider Care Teams.  These Care Teams include your primary Cardiologist (physician) and Advanced Practice Providers (APPs -  Physician Assistants and Nurse Practitioners) who all work together to provide you with the care you need, when you need it.  We recommend signing up for the patient portal called "MyChart".  Sign up information is provided on this After Visit Summary.  MyChart is used to connect with patients for Virtual Visits (Telemedicine).  Patients are able to view lab/test results, encounter notes, upcoming appointments, etc.  Non-urgent messages can be sent to your provider as well.   To learn more about what you can do with MyChart, go to [email or web address].    Your next appointment:   6 month(s)  The format for your next appointment:   In Person  Provider:   Paula Ross, MD     Other Instructions   Important Information About Sugar       

## 2022-04-04 ENCOUNTER — Other Ambulatory Visit: Payer: Self-pay | Admitting: Family Medicine

## 2022-04-04 DIAGNOSIS — I10 Essential (primary) hypertension: Secondary | ICD-10-CM

## 2022-04-16 ENCOUNTER — Ambulatory Visit (INDEPENDENT_AMBULATORY_CARE_PROVIDER_SITE_OTHER): Payer: PPO | Admitting: Family Medicine

## 2022-04-16 DIAGNOSIS — M4120 Other idiopathic scoliosis, site unspecified: Secondary | ICD-10-CM

## 2022-04-16 DIAGNOSIS — I10 Essential (primary) hypertension: Secondary | ICD-10-CM

## 2022-04-16 DIAGNOSIS — Z0000 Encounter for general adult medical examination without abnormal findings: Secondary | ICD-10-CM | POA: Diagnosis not present

## 2022-04-16 DIAGNOSIS — Z789 Other specified health status: Secondary | ICD-10-CM | POA: Diagnosis not present

## 2022-04-16 DIAGNOSIS — H9113 Presbycusis, bilateral: Secondary | ICD-10-CM | POA: Diagnosis not present

## 2022-04-16 DIAGNOSIS — E785 Hyperlipidemia, unspecified: Secondary | ICD-10-CM | POA: Diagnosis not present

## 2022-04-16 DIAGNOSIS — R7301 Impaired fasting glucose: Secondary | ICD-10-CM | POA: Diagnosis not present

## 2022-04-16 DIAGNOSIS — H409 Unspecified glaucoma: Secondary | ICD-10-CM

## 2022-04-16 MED ORDER — LISINOPRIL-HYDROCHLOROTHIAZIDE 20-12.5 MG PO TABS: 1.0000 | ORAL_TABLET | Freq: Every day | ORAL | 1 refills | Status: DC

## 2022-04-16 MED ORDER — AMLODIPINE BESYLATE 2.5 MG PO TABS: ORAL_TABLET | ORAL | 0 refills | Status: DC

## 2022-04-16 NOTE — Patient Instructions (Signed)
Health Maintenance, Male Adopting a healthy lifestyle and getting preventive care are important in promoting health and wellness. Ask your health care provider about: The right schedule for you to have regular tests and exams. Things you can do on your own to prevent diseases and keep yourself healthy. What should I know about diet, weight, and exercise? Eat a healthy diet  Eat a diet that includes plenty of vegetables, fruits, low-fat dairy products, and lean protein. Do not eat a lot of foods that are high in solid fats, added sugars, or sodium. Maintain a healthy weight Body mass index (BMI) is a measurement that can be used to identify possible weight problems. It estimates body fat based on height and weight. Your health care provider can help determine your BMI and help you achieve or maintain a healthy weight. Get regular exercise Get regular exercise. This is one of the most important things you can do for your health. Most adults should: Exercise for at least 150 minutes each week. The exercise should increase your heart rate and make you sweat (moderate-intensity exercise). Do strengthening exercises at least twice a week. This is in addition to the moderate-intensity exercise. Spend less time sitting. Even light physical activity can be beneficial. Watch cholesterol and blood lipids Have your blood tested for lipids and cholesterol at 75 years of age, then have this test every 5 years. You may need to have your cholesterol levels checked more often if: Your lipid or cholesterol levels are high. You are older than 75 years of age. You are at high risk for heart disease. What should I know about cancer screening? Many types of cancers can be detected early and may often be prevented. Depending on your health history and family history, you may need to have cancer screening at various ages. This may include screening for: Colorectal cancer. Prostate cancer. Skin cancer. Lung  cancer. What should I know about heart disease, diabetes, and high blood pressure? Blood pressure and heart disease High blood pressure causes heart disease and increases the risk of stroke. This is more likely to develop in people who have high blood pressure readings or are overweight. Talk with your health care provider about your target blood pressure readings. Have your blood pressure checked: Every 3-5 years if you are 18-39 years of age. Every year if you are 40 years old or older. If you are between the ages of 65 and 75 and are a current or former smoker, ask your health care provider if you should have a one-time screening for abdominal aortic aneurysm (AAA). Diabetes Have regular diabetes screenings. This checks your fasting blood sugar level. Have the screening done: Once every three years after age 45 if you are at a normal weight and have a low risk for diabetes. More often and at a younger age if you are overweight or have a high risk for diabetes. What should I know about preventing infection? Hepatitis B If you have a higher risk for hepatitis B, you should be screened for this virus. Talk with your health care provider to find out if you are at risk for hepatitis B infection. Hepatitis C Blood testing is recommended for: Everyone born from 1945 through 1965. Anyone with known risk factors for hepatitis C. Sexually transmitted infections (STIs) You should be screened each year for STIs, including gonorrhea and chlamydia, if: You are sexually active and are younger than 75 years of age. You are older than 75 years of age and your   health care provider tells you that you are at risk for this type of infection. Your sexual activity has changed since you were last screened, and you are at increased risk for chlamydia or gonorrhea. Ask your health care provider if you are at risk. Ask your health care provider about whether you are at high risk for HIV. Your health care provider  may recommend a prescription medicine to help prevent HIV infection. If you choose to take medicine to prevent HIV, you should first get tested for HIV. You should then be tested every 3 months for as long as you are taking the medicine. Follow these instructions at home: Alcohol use Do not drink alcohol if your health care provider tells you not to drink. If you drink alcohol: Limit how much you have to 0-2 drinks a day. Know how much alcohol is in your drink. In the [place] one drink equals one 12 oz bottle of beer (355 mL), one 5 oz glass of wine (148 mL), or one 1 oz glass of hard liquor (44 mL). Lifestyle Do not use any products that contain nicotine or tobacco. These products include cigarettes, chewing tobacco, and vaping devices, such as e-cigarettes. If you need help quitting, ask your health care provider. Do not use street drugs. Do not share needles. Ask your health care provider for help if you need support or information about quitting drugs. General instructions Schedule regular health, dental, and eye exams. Stay current with your vaccines. Tell your health care provider if: You often feel depressed. You have ever been abused or do not feel safe at home. Summary Adopting a healthy lifestyle and getting preventive care are important in promoting health and wellness. Follow your health care provider's instructions about healthy diet, exercising, and getting tested or screened for diseases. Follow your health care provider's instructions on monitoring your cholesterol and blood pressure. This information is not intended to replace advice given to you by your health care provider. Make sure you discuss any questions you have with your health care provider. Document Revised: 11/19/2020 Document Reviewed: 11/19/2020 Elsevier Patient Education  2023 [employer]  

## 2022-04-16 NOTE — Progress Notes (Signed)
Samuel ARNEY Sr. is a 76 y.o. male who presents for annual wellness visit and follow-up on chronic medical conditions.  He has no particular concerns or questions.  He will get immunizations at a later date with his wife and daughter.  He continues on amlodipine as well as lisinopril/HCTZ.  He does have a blood pressure cuff and it was measured against ours.  It is within 4 points of the same.  He also has tried statin drugs in the past and apparently has failed 2 of them because of the myalgias.  He does have presbycusis and is having difficulty with the hearing aids.  He does use Cialis on an as-needed basis for his ED. he does have a history of glaucoma and does follow-up with ophthalmology.   Immunizations and Health Maintenance Immunization History  Administered Date(s) Administered   Fluad Quad(high Dose 65+) 05/02/2019, 04/10/2020, 04/11/2021   Influenza Split 05/20/2011, 04/23/2012   Influenza, High Dose Seasonal PF 04/21/2013, 05/11/2014, 03/28/2015, 05/01/2016, 05/05/2017, 04/23/2018   PFIZER(Purple Top)SARS-COV-2 Vaccination 09/21/2019, 10/19/2019, 04/19/2020   Pfizer Covid-19 Vaccine Bivalent Booster 75yr & up 04/11/2021   Pneumococcal Conjugate-13 01/23/2014   Pneumococcal Polysaccharide-23 07/21/2007   Td 12/12/2014   Tdap 12/15/2003   Zoster Recombinat (Shingrix) 03/07/2019, 05/09/2019   Zoster, Live 07/21/2007   Health Maintenance Due  Topic Date Due   COVID-19 Vaccine (5 - Pfizer series) 08/11/2021   INFLUENZA VACCINE  02/11/2022    Last colonoscopy: 02/04/19  Dr. BTarri Glenn Last PSA: 11/17/13 ( .072) Dentist: Q six month  Ophtho: Q year Exercise: walking 4 times a week   Other doctors caring for patient include: Dr. BTarri GlennGI           Dr. RHarrington ChallengerCardiology             Dr. GKaty FitchEye  Advanced Directives: Does Patient Have a Medical Advance Directive?: No Would patient like information on creating a medical advance directive?: Yes (ED - Information included in  AVS)  Depression screen:  See questionnaire below.        04/16/2022    9:28 AM 04/11/2021    9:25 AM 04/10/2020   10:27 AM 12/20/2018    8:08 AM 12/17/2017    8:27 AM  Depression screen PHQ 2/9  Decreased Interest 0 0 0 0 0  Down, Depressed, Hopeless 0 0 0 0 0  PHQ - 2 Score 0 0 0 0 0    Fall Screen: See Questionaire below.      04/16/2022    9:28 AM 04/11/2021    9:24 AM 04/10/2020   10:27 AM 12/20/2018    8:08 AM 12/17/2017    8:27 AM  Fall Risk   Falls in the past year? 0 0 0 0 No  Number falls in past yr: 0 0     Injury with Fall? 0 0     Risk for fall due to : No Fall Risks No Fall Risks     Follow up Falls evaluation completed Falls evaluation completed       ADL screen:  See questionnaire below.  Functional Status Survey: Is the patient deaf or have difficulty hearing?: Yes Does the patient have difficulty seeing, even when wearing glasses/contacts?: No Does the patient have difficulty concentrating, remembering, or making decisions?: No Does the patient have difficulty walking or climbing stairs?: No Does the patient have difficulty dressing or bathing?: No Does the patient have difficulty doing errands alone such as visiting a doctor's office  or shopping?: No  The 10-year ASCVD risk score (Arnett DK, et al., 2019) is: 34.2%   Values used to calculate the score:     Age: 76 years     Sex: Male     Is Non-Hispanic African American: No     Diabetic: No     Tobacco smoker: No     Systolic Blood Pressure: 956 mmHg     Is BP treated: Yes     HDL Cholesterol: 55 mg/dL     Total Cholesterol: 199 mg/dL  Review of Systems  Constitutional: -, -unexpected weight change, -anorexia, -fatigue Allergy: -sneezing, -itching, -congestion Dermatology: denies changing moles, rash, lumps ENT: -runny nose, -ear pain, -sore throat,  Cardiology:  -chest pain, -palpitations, -orthopnea, Respiratory: -cough, -shortness of breath, -dyspnea on exertion, -wheezing,  Gastroenterology:  -abdominal pain, -nausea, -vomiting, -diarrhea, -constipation, -dysphagia Hematology: -bleeding or bruising problems Musculoskeletal: -arthralgias, -myalgias, -joint swelling, -back pain, - Ophthalmology: -vision changes,  Urology: -dysuria, -difficulty urinating,  -urinary frequency, -urgency, incontinence Neurology: -, -numbness, , -memory loss, -falls, -dizziness    PHYSICAL EXAM:  BP (!) 148/80   Pulse 64   Ht '5\' 8"'$  (1.727 m)   Wt 165 lb 9.6 oz (75.1 kg)   SpO2 98%   BMI 25.18 kg/m   General Appearance: Alert, cooperative, no distress, appears stated age Head: Normocephalic, without obvious abnormality, atraumatic Eyes: PERRL, conjunctiva/corneas clear, EOM's intact,  Ears: Normal TM's and external ear canals Nose: Nares normal, mucosa normal, no drainage or sinus   tenderness Throat: Lips, mucosa, and tongue normal; teeth and gums normal Neck: Supple, no lymphadenopathy, thyroid:no enlargement/tenderness/nodules; no carotid bruit or JVD Lungs: Clear to auscultation bilaterally without wheezes, rales or ronchi; respirations unlabored Heart: Regular rate and rhythm, S1 and S2 normal, no murmur, rub or gallop Abdomen: Soft, non-tender, nondistended, normoactive bowel sounds, no masses, no hepatosplenomegaly Skin: Skin color, texture, turgor normal, no rashes or lesions Lymph nodes: Cervical, supraclavicular, and axillary nodes normal Neurologic: CNII-XII intact, normal strength, sensation and gait; reflexes 2+ and symmetric throughout   Psych: Normal mood, affect, hygiene and grooming  ASSESSMENT/PLAN: Routine general medical examination at a health care facility - Plan: CBC with Differential/Platelet, Comprehensive metabolic panel, Lipid panel  Statin intolerance  Hyperlipidemia LDL goal <100 - Plan: Lipid panel  Essential hypertension - Plan: CBC with Differential/Platelet, Comprehensive metabolic panel, lisinopril-hydrochlorothiazide (ZESTORETIC) 20-12.5 MG tablet,  amLODipine (NORVASC) 2.5 MG tablet  Glaucoma, unspecified glaucoma type, unspecified laterality  Presbycusis of both ears  Other idiopathic scoliosis, unspecified spinal region He is to keep track of his blood pressures at home and let us know what the readings are.  Recommend once per month.  Also discussed starting him on another medication for his lipids since he is at risk based on ASCVD criteria.  He will discuss this with his cardiologist.  Recommend he follow-up with the hearing specialist to see if the hearing aids can be readjusted as he is having difficulty with that.   Discussed Immunization recommendations discussed.  Colonoscopy recommendations reviewed.   Medicare Attestation I have personally reviewed: [name] medical and social history Their use of alcohol, tobacco or illicit drugs Their current medications and supplements [name] functional ability including ADLs,fall risks, home safety risks, cognitive, and hearing and visual impairment Diet and physical activities Evidence for depression or mood disorders  [name] weight, height, and BMI have been recorded in the chart.  I have made referrals, counseling, and provided education to the patient based on  review of the above and I have provided the patient with a written personalized care plan for preventive services.     Jill Alexanders, MD   04/16/2022

## 2022-04-17 LAB — CBC WITH DIFFERENTIAL/PLATELET
Basophils Absolute: 0.1 10*3/uL (ref 0.0–0.2)
Basos: 1 %
EOS (ABSOLUTE): 0.2 10*3/uL (ref 0.0–0.4)
Eos: 5 %
Hematocrit: 42.2 % (ref 37.5–51.0)
Hemoglobin: 14.7 g/dL (ref 13.0–17.7)
Immature Grans (Abs): 0 10*3/uL (ref 0.0–0.1)
Immature Granulocytes: 0 %
Lymphocytes Absolute: 1.5 10*3/uL (ref 0.7–3.1)
Lymphs: 31 %
MCH: 30.2 pg (ref 26.6–33.0)
MCHC: 34.8 g/dL (ref 31.5–35.7)
MCV: 87 fL (ref 79–97)
Monocytes Absolute: 0.4 10*3/uL (ref 0.1–0.9)
Monocytes: 9 %
Neutrophils Absolute: 2.5 10*3/uL (ref 1.4–7.0)
Neutrophils: 54 %
Platelets: 272 10*3/uL (ref 150–450)
RBC: 4.87 x10E6/uL (ref 4.14–5.80)
RDW: 11.2 % — ABNORMAL LOW (ref 11.6–15.4)
WBC: 4.7 10*3/uL (ref 3.4–10.8)

## 2022-04-17 LAB — COMPREHENSIVE METABOLIC PANEL
ALT: 23 IU/L (ref 0–44)
AST: 25 IU/L (ref 0–40)
Albumin/Globulin Ratio: 1.6 (ref 1.2–2.2)
Albumin: 4.4 g/dL (ref 3.8–4.8)
Alkaline Phosphatase: 52 IU/L (ref 44–121)
BUN/Creatinine Ratio: 24 (ref 10–24)
BUN: 16 mg/dL (ref 8–27)
Bilirubin Total: 0.8 mg/dL (ref 0.0–1.2)
CO2: 27 mmol/L (ref 20–29)
Calcium: 9.6 mg/dL (ref 8.6–10.2)
Chloride: 97 mmol/L (ref 96–106)
Creatinine, Ser: 0.66 mg/dL — ABNORMAL LOW (ref 0.76–1.27)
Globulin, Total: 2.8 g/dL (ref 1.5–4.5)
Glucose: 109 mg/dL — ABNORMAL HIGH (ref 70–99)
Potassium: 4.4 mmol/L (ref 3.5–5.2)
Sodium: 137 mmol/L (ref 134–144)
Total Protein: 7.2 g/dL (ref 6.0–8.5)
eGFR: 98 mL/min/{1.73_m2} (ref >59)

## 2022-04-17 LAB — LIPID PANEL
Chol/HDL Ratio: 3.5 ratio (ref 0.0–5.0)
Cholesterol, Total: 187 mg/dL (ref 100–199)
HDL: 54 mg/dL (ref >39)
LDL Chol Calc (NIH): 120 mg/dL — ABNORMAL HIGH (ref 0–99)
Triglycerides: 70 mg/dL (ref 0–149)
VLDL Cholesterol Cal: 13 mg/dL (ref 5–40)

## 2022-04-22 ENCOUNTER — Encounter: Payer: Self-pay | Admitting: Internal Medicine

## 2022-04-22 LAB — HGB A1C W/O EAG: Hgb A1c MFr Bld: 5.7 % — ABNORMAL HIGH (ref 4.8–5.6)

## 2022-04-22 LAB — SPECIMEN STATUS REPORT

## 2022-04-29 ENCOUNTER — Telehealth: Payer: Self-pay | Admitting: Internal Medicine

## 2022-04-29 DIAGNOSIS — Z789 Other specified health status: Secondary | ICD-10-CM

## 2022-04-29 DIAGNOSIS — E785 Hyperlipidemia, unspecified: Secondary | ICD-10-CM

## 2022-04-29 NOTE — Telephone Encounter (Signed)
Patient's wife is calling stating patient's PCP advised they notify Dr. Harrington Challenger he had labs performed on 10/04 for her to review. Please advise.

## 2022-04-29 NOTE — Telephone Encounter (Signed)
Pt called per message below, and told that I will let Dr. Harrington Challenger know that he: 1. Had is physical with Dr. Redmond School, PCP done;  2. That his lab results are complete / posted in Epic from his 04/16/2022 visit.    Will forward info to Dr. Harrington Challenger.

## 2022-05-02 NOTE — Telephone Encounter (Signed)
Referral placed for the Lipid Clinic/ Pharm D.

## 2022-05-02 NOTE — Telephone Encounter (Signed)
Called patient   LDL too high    Has CAD  He did not tolerate lipitor in the past  He says he did not tolerate a couple more sttins   Achy Would recomm Repatha  Will forward to Hughie Closs so that tolerance etc can be followed

## 2022-05-05 ENCOUNTER — Encounter: Payer: Self-pay | Admitting: Internal Medicine

## 2022-05-09 ENCOUNTER — Other Ambulatory Visit (INDEPENDENT_AMBULATORY_CARE_PROVIDER_SITE_OTHER): Payer: PPO

## 2022-05-09 DIAGNOSIS — Z23 Encounter for immunization: Secondary | ICD-10-CM

## 2022-05-13 NOTE — Progress Notes (Signed)
Lipids in early October show LDL over 100   Ideally, with known CAD would be 70    He is on Red Yeast Rice   ? What happens, has he tried statin    REcmm Lipitor 20 if he has not with lipomed and liver panel 8 wks after

## 2022-05-14 ENCOUNTER — Telehealth: Payer: Self-pay

## 2022-05-14 NOTE — Telephone Encounter (Signed)
Per Dr Harrington Challenger:   Editor: Fay Records, MD (Physician)         Lipids in early October show LDL over 100   Ideally, with known CAD would be 59    He is on Red Yeast Rice   ? What happens, has he tried statin    REcmm Lipitor 20 if he has not with lipomed and liver panel 8 wks after

## 2022-05-14 NOTE — Telephone Encounter (Signed)
-----   Message from Fay Records, MD sent at 05/13/2022  4:09 PM EDT -----    ----- Message ----- From: Fay Records, MD Sent: 04/02/2022   6:20 PM EDT To: Fay Records, MD  Follow up on lipids by lalonde in October

## 2022-05-18 NOTE — Progress Notes (Unsigned)
Patient ID: Samuel KORFF Sr.                 DOB: 19-Aug-1945                    MRN: [id number]     HPI: Samuel GRIM Sr. is a 76 y.o. male patient referred to lipid clinic by Dr. Harrington Challenger. PMH is significant for HTN, HLD, and glaucoma. Patient underwent Coronary CT on 11/22/20 showing a total coronary artery calcium score of 99.1, placing him in the 35th percentile. [name] was last seen by Dr. Harrington Challenger for HTN and CAD. At that visit, a lipid panel was ordered showing an LDL of 120. Given his history of statin intolerance, he was referred to PharmD lipid clinic.  Pt presents today with flat affect. He voiced confusion regarding the reason for the visit. He reports no chest pain, palpitations, myalgias, or fatigue. He reports a history of intolerance to 3 separate statins, including atorvastatin, rosuvastatin, and another unknown statin. He had severe myalgias with each. He voiced confusion regarding what his lipid panel and coronary artery calcium score mean. He reports that each statin he has started causes myalgias within 1 week and symptoms resolve within a week of stopping.   Current Medications: Red Yeast Rice Intolerances: Atorvastatin, Rosuvastatin, and one other statin dose unknown (Myalgias) Risk Factors: HTN, CAD LDL goal: <70  Diet:  -Breakfast: grilled chicken, cereal - raison brand, oatmeal or fruits -Supper: fish, salad, pinto beans Exercise:  -Not much in the last few weeks, but previously walked his treadmill 4-5 days per week for 25-30 minutes per day Family History:  Family History  Problem Relation Age of Onset   Heart failure Mother    Heart attack Mother    Heart disease Mother    Cancer Father    Cancer Sister    Lung cancer Sister    Cancer Brother    Esophageal cancer Brother    Coronary artery disease Brother    Heart disease Brother    COPD Brother     Social History:  Social History   Socioeconomic History   Marital status: Married    Spouse name: Not on  file   Number of children: Not on file   Years of education: Not on file   Highest education level: Not on file  Occupational History   Not on file  Tobacco Use   Smoking status: Never   Smokeless tobacco: Never  Vaping Use   Vaping Use: Never used  Substance and Sexual Activity   Alcohol use: No   Drug use: No   Sexual activity: Yes  Other Topics Concern   Not on file  Social History Narrative   Not on file   Social Determinants of Health   Financial Resource Strain: Low Risk  (04/16/2022)   Overall Financial Resource Strain (CARDIA)    Difficulty of Paying Living Expenses: Not hard at all  Food Insecurity: No Food Insecurity (04/16/2022)   Hunger Vital Sign    Worried About Running Out of Food in the Last Year: Never true    Ran Out of Food in the Last Year: Never true  Transportation Needs: No Transportation Needs (04/16/2022)   PRAPARE - Hydrologist (Medical): No    Lack of Transportation (Non-Medical): No  Physical Activity: Insufficiently Active (04/16/2022)   Exercise Vital Sign    Days of Exercise per Week: 4 days  Minutes of Exercise per Session: 30 min  Stress: No Stress Concern Present (04/16/2022)   [place]    Feeling of Stress : Not at all  Social Connections: [place] (04/16/2022)   Social Connection and Isolation Panel [NHANES]    Frequency of Communication with Friends and Family: More than three times a week    Frequency of Social Gatherings with Friends and Family: More than three times a week    Attends Religious Services: More than 4 times per year    Active Member of [employer] or Organizations: Yes    Attends Archivist Meetings: 1 to 4 times per year    Marital Status: Married  Recent Concern: Social Connections - Moderately Isolated (04/16/2022)   Social Connection and Isolation Panel [NHANES]    Frequency of Communication with Friends  and Family: Never    Frequency of Social Gatherings with Friends and Family: More than three times a week    Attends Religious Services: Never    Marine scientist or Organizations: No    Attends Archivist Meetings: Never    Marital Status: Married  Human resources officer Violence: Not At Risk (04/16/2022)   Humiliation, Afraid, Rape, and Kick questionnaire    Fear of Current or Ex-Partner: No    Emotionally Abused: No    Physically Abused: No    Sexually Abused: No    Labs: 11/11/2011 on unknown treatment -TC 218, TG 144, HDL 45, VLDL 29, LDL 144  04/11/2021 on unknown treatment -TC 199, TG 99, HDL 55, VLDL 15, LDL 126  04/17/2022 on red yeast rice and fish oil -TC 187, TG 70, HDL 54, VLDL 13, LDL 120. AST 25, ALT 23   Past Medical History:  Diagnosis Date   Cataract    had Bil   Cough    on and off   Dyslipidemia    History of colon polyps    HTN (hypertension)    Hyperlipidemia    Scoliosis     Current Outpatient Medications on File Prior to Visit  Medication Sig Dispense Refill   Acetaminophen (TYLENOL 8 HOUR PO) Take by mouth.     albuterol (VENTOLIN HFA) 108 (90 Base) MCG/ACT inhaler Inhale 2 puffs into the lungs every 6 (six) hours as needed for wheezing or shortness of breath. (Patient not taking: Reported on 04/16/2022) 8 g 0   amLODipine (NORVASC) 2.5 MG tablet TAKE 1 TABLET(2.5 MG) BY MOUTH DAILY 60 tablet 0   amoxicillin (AMOXIL) 875 MG tablet Take 1 tablet (875 mg total) by mouth 2 (two) times daily. 20 tablet 0   Ascorbic Acid (VITAMIN C) 500 MG tablet Take 1,000 mg by mouth daily.     aspirin 81 MG tablet Take 81 mg by mouth every other day.     co-enzyme Q-10 30 MG capsule Take 200 mg by mouth daily.     guaiFENesin (MUCINEX) 600 MG 12 hr tablet Take by mouth 2 (two) times daily. (Patient not taking: Reported on 04/16/2022)     lisinopril-hydrochlorothiazide (ZESTORETIC) 20-12.5 MG tablet Take 1 tablet by mouth daily. 90 tablet 1   Multiple  Vitamin (MULTIVITAMIN) capsule Take 1 capsule by mouth daily.     Omega-3 Fatty Acids (FISH OIL PO) Take 2 tablets by mouth daily.     PROBIOTIC CAPS Take by mouth daily.     Psyllium (METAMUCIL PO) Take 19 g by mouth. As directed     Red  Yeast Rice POWD 600 mg 2 tabs two times per day     tadalafil (CIALIS) 20 MG tablet Take 1 tablet (20 mg total) by mouth daily as needed for erectile dysfunction. (Patient not taking: Reported on 04/16/2022) 10 tablet 5   Current Facility-Administered Medications on File Prior to Visit  Medication Dose Route Frequency Provider Last Rate Last Admin   influenza  inactive virus vaccine (FLUZONE/FLUARIX) injection 0.5 mL  0.5 mL Intramuscular Once [name], MD        No Known Allergies  Assessment/Plan:  1. Hyperlipidemia - Most recent LDL of 120 mg/dL on Red Yeast Rice and fish oil is above goal. Patient was very resistant to any changes in lipid lowering therapy, especially to injectable medications. Extensive time was spent educating the patient on how lipid lowering therapy relates to coronary artery disease. He was educated on heart healthy diet and exercise. He initially declined any changes in therapy because he feels that his risk is overall very low. He eventually agrees to trial pravastatin 20 mg at bedtime in addition to lifestyle modifications. Stop red yeast rice. Will recheck lipid panel in 2 months. Patient was also educated on other lipid lowering medications such at [place]. Will follow up lipid panel in 2 months to assess efficacy and tolerability of statin therapy.   Thank you for allowing pharmacy to participate in this patient's care.  Reatha Harps, PharmD PGY2 Pharmacy Resident 05/19/2022 5:31 PM

## 2022-05-19 ENCOUNTER — Ambulatory Visit: Payer: PPO | Attending: Cardiology | Admitting: Pharmacist

## 2022-05-19 DIAGNOSIS — Z789 Other specified health status: Secondary | ICD-10-CM

## 2022-05-19 DIAGNOSIS — E785 Hyperlipidemia, unspecified: Secondary | ICD-10-CM | POA: Diagnosis not present

## 2022-05-19 MED ORDER — PRAVASTATIN SODIUM 20 MG PO TABS: 20.0000 mg | ORAL_TABLET | Freq: Every evening | ORAL | 11 refills | Status: DC

## 2022-05-19 NOTE — Patient Instructions (Addendum)
Start pravastatin 20 mg daily Work on heart healthy diet and exercise, repeat lipid panel in 2 months

## 2022-05-20 NOTE — Telephone Encounter (Signed)
Result and message sent to the pts My Chart for their review.   Will follow up with him.

## 2022-05-22 ENCOUNTER — Other Ambulatory Visit: Payer: Self-pay | Admitting: Internal Medicine

## 2022-05-22 DIAGNOSIS — I10 Essential (primary) hypertension: Secondary | ICD-10-CM

## 2022-05-29 NOTE — Telephone Encounter (Signed)
Pt is being managed by the Lipid clinic and was seen 05/19/22... labs planned for Jan 2024.

## 2022-07-21 ENCOUNTER — Other Ambulatory Visit: Payer: PPO

## 2022-08-25 DIAGNOSIS — H02831 Dermatochalasis of right upper eyelid: Secondary | ICD-10-CM | POA: Diagnosis not present

## 2022-08-25 DIAGNOSIS — Z961 Presence of intraocular lens: Secondary | ICD-10-CM | POA: Diagnosis not present

## 2022-08-25 DIAGNOSIS — H43811 Vitreous degeneration, right eye: Secondary | ICD-10-CM | POA: Diagnosis not present

## 2022-08-25 DIAGNOSIS — H04123 Dry eye syndrome of bilateral lacrimal glands: Secondary | ICD-10-CM | POA: Diagnosis not present

## 2022-08-25 DIAGNOSIS — H53021 Refractive amblyopia, right eye: Secondary | ICD-10-CM | POA: Diagnosis not present

## 2022-08-25 DIAGNOSIS — H02834 Dermatochalasis of left upper eyelid: Secondary | ICD-10-CM | POA: Diagnosis not present

## 2022-08-25 DIAGNOSIS — D3132 Benign neoplasm of left choroid: Secondary | ICD-10-CM | POA: Diagnosis not present

## 2022-08-25 DIAGNOSIS — H40013 Open angle with borderline findings, low risk, bilateral: Secondary | ICD-10-CM | POA: Diagnosis not present

## 2022-09-19 ENCOUNTER — Ambulatory Visit (INDEPENDENT_AMBULATORY_CARE_PROVIDER_SITE_OTHER): Payer: PPO

## 2022-09-19 ENCOUNTER — Ambulatory Visit: Payer: PPO | Admitting: Orthopaedic Surgery

## 2022-09-19 ENCOUNTER — Encounter: Payer: Self-pay | Admitting: Orthopaedic Surgery

## 2022-09-19 VITALS — BP 166/83 | HR 75 | Ht 70.5 in | Wt 165.0 lb

## 2022-09-19 DIAGNOSIS — M79641 Pain in right hand: Secondary | ICD-10-CM

## 2022-09-19 DIAGNOSIS — S66911S Strain of unspecified muscle, fascia and tendon at wrist and hand level, right hand, sequela: Secondary | ICD-10-CM | POA: Diagnosis not present

## 2022-09-19 DIAGNOSIS — M25512 Pain in left shoulder: Secondary | ICD-10-CM

## 2022-09-19 DIAGNOSIS — M79642 Pain in left hand: Secondary | ICD-10-CM | POA: Diagnosis not present

## 2022-09-19 DIAGNOSIS — M7542 Impingement syndrome of left shoulder: Secondary | ICD-10-CM | POA: Diagnosis not present

## 2022-09-19 MED ORDER — BUPIVACAINE HCL 0.25 % IJ SOLN
4.0000 mL | INTRAMUSCULAR | Status: AC | PRN
  Administered 2022-09-19: 4 mL via INTRA_ARTICULAR

## 2022-09-19 MED ORDER — LIDOCAINE HCL 1 % IJ SOLN
0.5000 mL | INTRAMUSCULAR | Status: AC | PRN
  Administered 2022-09-19: .5 mL

## 2022-09-19 MED ORDER — METHYLPREDNISOLONE ACETATE 40 MG/ML IJ SUSP
40.0000 mg | INTRAMUSCULAR | Status: AC | PRN
  Administered 2022-09-19: 40 mg via INTRA_ARTICULAR

## 2022-09-19 NOTE — Progress Notes (Signed)
Office Visit Note   Patient: Samuel ORBIN Sr.           Date of Birth: 1946/04/30           MRN: [id number] Visit Date: 09/19/2022              Requested by: [name], MD [place],  [place] [address] PCP: [name], MD   Assessment & Plan: Visit Diagnoses:  1. Acute pain of left shoulder   2. Pain in right hand     Plan: Left shoulder injection performed with immediate relief he is able to get his arm up over his head.  He has persistent problems he will call and let us know in a few weeks we can consider further diagnostic imaging.  We discussed buddy taping long to ring finger when he is doing a lot of active work with his hand to help.  He likely had a MCP strain with some residual soreness.  He can use some ice or Aspercreme.  Follow-Up Instructions: No follow-ups on file.   Orders:  Orders Placed This Encounter  Procedures   XR Shoulder Left   XR Hand Complete Right   No orders of the defined types were placed in this encounter.     Procedures: Large Joint Inj: L subacromial bursa on 09/19/2022 8:48 AM Indications: pain Details: 22 G 1.5 in needle  Arthrogram: No  Medications: 4 mL bupivacaine 0.25 %; 40 mg methylPREDNISolone acetate 40 MG/ML; 0.5 mL lidocaine 1 % Outcome: tolerated well, no immediate complications Procedure, treatment alternatives, risks and benefits explained, specific risks discussed. Consent was given by the patient. Immediately prior to procedure a time out was called to verify the correct patient, procedure, equipment, support staff and site/side marked as required. Patient was prepped and draped in the usual sterile fashion.       Clinical Data: No additional findings.   Subjective: Chief Complaint  Patient presents with   Left Shoulder - Pain   Right Hand - Pain    HPI 77 year old male with 2 problems 1 is right hand pain after drill he was using twisted rapidly in his hands he felt like his long  finger got hyperextended and twisted.  His other problem is his left shoulder.  He has a daughter is handicapped has to lift her has noted gradually is not able to get his left arm up over his head with straight abduction.  He is right-hand dominant can get his right arm over his hand easily.  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  He denies numbness or tingling in his left upper extremity.  He does have some scoliosis.  Also hypertension.  Review of Systems all the systems are noncontributory to HPI.   Objective: Vital Signs: BP (!) 166/83   Pulse 75   Ht 5' 10.5" (1.791 m)   Wt 165 lb (74.8 kg)   BMI 23.34 kg/m   Physical Exam Constitutional:      Appearance: He is well-developed.  HENT:     Head: Normocephalic and atraumatic.     Right Ear: External ear normal.     Left Ear: External ear normal.  Eyes:     Pupils: Pupils are equal, round, and reactive to light.  Neck:     Thyroid: No thyromegaly.     Trachea: No tracheal deviation.  Cardiovascular:     Rate and Rhythm: Normal rate.  Pulmonary:     Effort: Pulmonary effort is normal.     Breath sounds: No wheezing.  Abdominal:     General: Bowel sounds are normal.     Palpations: Abdomen is soft.  Musculoskeletal:     Cervical back: Neck supple.  Skin:    General: Skin is warm and dry.     Capillary Refill: Capillary refill takes less than 2 seconds.  Neurological:     Mental Status: He is alert and oriented to person, place, and time.  Psychiatric:        Behavior: Behavior normal.        Thought Content: Thought content normal.        Judgment: Judgment normal.   Glasses, scoliosis.  Ortho Exam positive impingement left shoulder negative right shoulder.  He takes resistance with isolated supraspinatus testing but does have significant pain and has some weakness.  Subscap external rotation is strong.  Long head of the biceps is normal.  No periscapular atrophy.  Full  flexion-extension right hand no triggering.  He has some tenderness over the third MCP joint.  PIP stable full extension.  Mild discomfort over the dorsal third MCP with full extension.  Upper extremity reflexes are 2+ and symmetrical upper and lower.  Normal heel-toe gait.  Specialty Comments:  No specialty comments available.  Imaging: No results found.   PMFS History: Patient Active Problem List   Diagnosis Date Noted   Statin intolerance 04/16/2022   Glaucoma 08/20/2020   Hearing loss 03/30/2018   Idiopathic scoliosis 11/15/2012   Hyperlipidemia LDL goal <100 02/10/2009   Essential hypertension 02/10/2009   Past Medical History:  Diagnosis Date   Cataract    had Bil   Cough    on and off   Dyslipidemia    History of colon polyps    HTN (hypertension)    Hyperlipidemia    Scoliosis     Family History  Problem Relation Age of Onset   Heart failure Mother    Heart attack Mother    Heart disease Mother    Cancer Father    Cancer Sister    Lung cancer Sister    Cancer Brother    Esophageal cancer Brother    Coronary artery disease Brother    Heart disease Brother    COPD Brother     Past Surgical History:  Procedure Laterality Date   COLONOSCOPY  2010   peters   HERNIA REPAIR     right inguinal hernia   HIP SURGERY     right inguinal hernia     scoliosis     x2   TONSILLECTOMY     Social History   Occupational History   Not on file  Tobacco Use   Smoking status: Never   Smokeless tobacco: Never  Vaping Use   Vaping Use: Never used  Substance and Sexual Activity   Alcohol use: No   Drug use: No   Sexual activity: Yes

## 2022-10-02 DIAGNOSIS — H02411 Mechanical ptosis of right eyelid: Secondary | ICD-10-CM | POA: Diagnosis not present

## 2022-10-02 DIAGNOSIS — H02413 Mechanical ptosis of bilateral eyelids: Secondary | ICD-10-CM | POA: Diagnosis not present

## 2022-10-02 DIAGNOSIS — H53483 Generalized contraction of visual field, bilateral: Secondary | ICD-10-CM | POA: Diagnosis not present

## 2022-10-02 DIAGNOSIS — H0279 Other degenerative disorders of eyelid and periocular area: Secondary | ICD-10-CM | POA: Diagnosis not present

## 2022-10-02 DIAGNOSIS — H57813 Brow ptosis, bilateral: Secondary | ICD-10-CM | POA: Diagnosis not present

## 2022-10-02 DIAGNOSIS — H02834 Dermatochalasis of left upper eyelid: Secondary | ICD-10-CM | POA: Diagnosis not present

## 2022-10-02 DIAGNOSIS — H02412 Mechanical ptosis of left eyelid: Secondary | ICD-10-CM | POA: Diagnosis not present

## 2022-10-02 DIAGNOSIS — H02831 Dermatochalasis of right upper eyelid: Secondary | ICD-10-CM | POA: Diagnosis not present

## 2022-10-02 DIAGNOSIS — Z01818 Encounter for other preprocedural examination: Secondary | ICD-10-CM | POA: Diagnosis not present

## 2022-10-09 ENCOUNTER — Other Ambulatory Visit: Payer: Self-pay | Admitting: Family Medicine

## 2022-10-09 DIAGNOSIS — I10 Essential (primary) hypertension: Secondary | ICD-10-CM

## 2022-10-20 DIAGNOSIS — H53483 Generalized contraction of visual field, bilateral: Secondary | ICD-10-CM | POA: Diagnosis not present

## 2022-12-09 DIAGNOSIS — H02411 Mechanical ptosis of right eyelid: Secondary | ICD-10-CM | POA: Diagnosis not present

## 2022-12-09 DIAGNOSIS — H02831 Dermatochalasis of right upper eyelid: Secondary | ICD-10-CM | POA: Diagnosis not present

## 2022-12-09 DIAGNOSIS — H57813 Brow ptosis, bilateral: Secondary | ICD-10-CM | POA: Diagnosis not present

## 2022-12-09 DIAGNOSIS — H0279 Other degenerative disorders of eyelid and periocular area: Secondary | ICD-10-CM | POA: Diagnosis not present

## 2022-12-09 DIAGNOSIS — H02412 Mechanical ptosis of left eyelid: Secondary | ICD-10-CM | POA: Diagnosis not present

## 2022-12-09 DIAGNOSIS — H02413 Mechanical ptosis of bilateral eyelids: Secondary | ICD-10-CM | POA: Diagnosis not present

## 2022-12-09 DIAGNOSIS — H02834 Dermatochalasis of left upper eyelid: Secondary | ICD-10-CM | POA: Diagnosis not present

## 2022-12-09 DIAGNOSIS — H53483 Generalized contraction of visual field, bilateral: Secondary | ICD-10-CM | POA: Diagnosis not present

## 2022-12-09 DIAGNOSIS — Z01818 Encounter for other preprocedural examination: Secondary | ICD-10-CM | POA: Diagnosis not present

## 2023-01-20 ENCOUNTER — Ambulatory Visit (INDEPENDENT_AMBULATORY_CARE_PROVIDER_SITE_OTHER): Payer: PPO

## 2023-01-20 VITALS — Ht 71.0 in | Wt 164.0 lb

## 2023-01-20 DIAGNOSIS — Z0000 Encounter for general adult medical examination without abnormal findings: Secondary | ICD-10-CM

## 2023-01-20 NOTE — Patient Instructions (Signed)
[name] , Thank you for taking time to come for your Medicare Wellness Visit. I appreciate your ongoing commitment to your health goals. Please review the following plan we discussed and let me know if I can assist you in the future.   These are the goals we discussed:  Goals      Patient Stated     01/20/2023, denies any health goals        This is a list of the screening recommended for you and due dates:  Health Maintenance  Topic Date Due   Pneumonia Vaccine (3 of 3 - PPSV23 or PCV20) 01/24/2019   COVID-19 Vaccine (5 - 2023-24 season) 03/14/2022   Flu Shot  02/12/2023   Medicare Annual Wellness Visit  01/20/2024   DTaP/Tdap/Td vaccine (3 - Td or Tdap) 12/11/2024   Hepatitis C Screening  Completed   Zoster (Shingles) Vaccine  Completed   HPV Vaccine  Aged Out   Colon Cancer Screening  Discontinued    Advanced directives: Advance directive discussed with you today.   Conditions/risks identified: none  Next appointment: Follow up in one year for your annual wellness visit.   Preventive Care 84 Years and Older, Male  Preventive care refers to lifestyle choices and visits with your health care provider that can promote health and wellness. What does preventive care include? A yearly physical exam. This is also called an annual well check. Dental exams once or twice a year. Routine eye exams. Ask your health care provider how often you should have your eyes checked. Personal lifestyle choices, including: Daily care of your teeth and gums. Regular physical activity. Eating a healthy diet. Avoiding tobacco and drug use. Limiting alcohol use. Practicing safe sex. Taking low doses of aspirin every day. Taking vitamin and mineral supplements as recommended by your health care provider. What happens during an annual well check? The services and screenings done by your health care provider during your annual well check will depend on your age, overall health, lifestyle risk  factors, and family history of disease. Counseling  Your health care provider may ask you questions about your: Alcohol use. Tobacco use. Drug use. Emotional well-being. Home and relationship well-being. Sexual activity. Eating habits. History of falls. Memory and ability to understand (cognition). Work and work Astronomer. Screening  You may have the following tests or measurements: Height, weight, and BMI. Blood pressure. Lipid and cholesterol levels. These may be checked every 5 years, or more frequently if you are over 9 years old. Skin check. Lung cancer screening. You may have this screening every year starting at age 72 if you have a 30-pack-year history of smoking and currently smoke or have quit within the past 15 years. Fecal occult blood test (FOBT) of the stool. You may have this test every year starting at age 25. Flexible sigmoidoscopy or colonoscopy. You may have a sigmoidoscopy every 5 years or a colonoscopy every 10 years starting at age 71. Prostate cancer screening. Recommendations will vary depending on your family history and other risks. Hepatitis C blood test. Hepatitis B blood test. Sexually transmitted disease (STD) testing. Diabetes screening. This is done by checking your blood sugar (glucose) after you have not eaten for a while (fasting). You may have this done every 1-3 years. Abdominal aortic aneurysm (AAA) screening. You may need this if you are a current or former smoker. Osteoporosis. You may be screened starting at age 21 if you are at high risk. Talk with your health care provider  about your test results, treatment options, and if necessary, the need for more tests. Vaccines  Your health care provider may recommend certain vaccines, such as: Influenza vaccine. This is recommended every year. Tetanus, diphtheria, and acellular pertussis (Tdap, Td) vaccine. You may need a Td booster every 10 years. Zoster vaccine. You may need this after age  58. Pneumococcal 13-valent conjugate (PCV13) vaccine. One dose is recommended after age 54. Pneumococcal polysaccharide (PPSV23) vaccine. One dose is recommended after age 52. Talk to your health care provider about which screenings and vaccines you need and how often you need them. This information is not intended to replace advice given to you by your health care provider. Make sure you discuss any questions you have with your health care provider. Document Released: 07/27/2015 Document Revised: 03/19/2016 Document Reviewed: 05/01/2015 Elsevier Interactive Patient Education  2017 [employer].  Fall Prevention in the Home Falls can cause injuries. They can happen to people of all ages. There are many things you can do to make your home safe and to help prevent falls. What can I do on the outside of my home? Regularly fix the edges of walkways and driveways and fix any cracks. Remove anything that might make you trip as you walk through a door, such as a raised step or threshold. Trim any bushes or trees on the path to your home. Use bright outdoor lighting. Clear any walking paths of anything that might make someone trip, such as rocks or tools. Regularly check to see if handrails are loose or broken. Make sure that both sides of any steps have handrails. Any raised decks and porches should have guardrails on the edges. Have any leaves, snow, or ice cleared regularly. Use sand or salt on walking paths during winter. Clean up any spills in your garage right away. This includes oil or grease spills. What can I do in the bathroom? Use night lights. Install grab bars by the toilet and in the tub and shower. Do not use towel bars as grab bars. Use non-skid mats or decals in the tub or shower. If you need to sit down in the shower, use a plastic, non-slip stool. Keep the floor dry. Clean up any water that spills on the floor as soon as it happens. Remove soap buildup in the tub or shower  regularly. Attach bath mats securely with double-sided non-slip rug tape. Do not have throw rugs and other things on the floor that can make you trip. What can I do in the bedroom? Use night lights. Make sure that you have a light by your bed that is easy to reach. Do not use any sheets or blankets that are too big for your bed. They should not hang down onto the floor. Have a firm chair that has side arms. You can use this for support while you get dressed. Do not have throw rugs and other things on the floor that can make you trip. What can I do in the kitchen? Clean up any spills right away. Avoid walking on wet floors. Keep items that you use a lot in easy-to-reach places. If you need to reach something above you, use a strong step stool that has a grab bar. Keep electrical cords out of the way. Do not use floor polish or wax that makes floors slippery. If you must use wax, use non-skid floor wax. Do not have throw rugs and other things on the floor that can make you trip. What can I do  with my stairs? Do not leave any items on the stairs. Make sure that there are handrails on both sides of the stairs and use them. Fix handrails that are broken or loose. Make sure that handrails are as long as the stairways. Check any carpeting to make sure that it is firmly attached to the stairs. Fix any carpet that is loose or worn. Avoid having throw rugs at the top or bottom of the stairs. If you do have throw rugs, attach them to the floor with carpet tape. Make sure that you have a light switch at the top of the stairs and the bottom of the stairs. If you do not have them, ask someone to add them for you. What else can I do to help prevent falls? Wear shoes that: Do not have high heels. Have rubber bottoms. Are comfortable and fit you well. Are closed at the toe. Do not wear sandals. If you use a stepladder: Make sure that it is fully opened. Do not climb a closed stepladder. Make sure that  both sides of the stepladder are locked into place. Ask someone to hold it for you, if possible. Clearly mark and make sure that you can see: Any grab bars or handrails. First and last steps. Where the edge of each step is. Use tools that help you move around (mobility aids) if they are needed. These include: Canes. Walkers. Scooters. Crutches. Turn on the lights when you go into a dark area. Replace any light bulbs as soon as they burn out. Set up your furniture so you have a clear path. Avoid moving your furniture around. If any of your floors are uneven, fix them. If there are any pets around you, be aware of where they are. Review your medicines with your doctor. Some medicines can make you feel dizzy. This can increase your chance of falling. Ask your doctor what other things that you can do to help prevent falls. This information is not intended to replace advice given to you by your health care provider. Make sure you discuss any questions you have with your health care provider. Document Released: 04/26/2009 Document Revised: 12/06/2015 Document Reviewed: 08/04/2014 Elsevier Interactive Patient Education  2017 [employer].

## 2023-01-20 NOTE — Progress Notes (Signed)
Subjective:   Samuel PORTNOY Sr. is a 77 y.o. male who presents for Medicare Annual/Subsequent preventive examination.  Visit Complete: Virtual  I connected with  Samuel ECKRICH Sr. on 01/20/23 by a audio enabled telemedicine application and verified that I am speaking with the correct person using two identifiers.  Patient Location: Home  Provider Location: Office/Clinic  I discussed the limitations of evaluation and management by telemedicine. The patient expressed understanding and agreed to proceed.    Review of Systems     Cardiac Risk Factors include: advanced age (>62men, >80 women);dyslipidemia;hypertension;male gender     Objective:    Today's Vitals   01/20/23 1126  Weight: 164 lb (74.4 kg)  Height: 5\' 11"  (1.803 m)   Body mass index is 22.87 kg/m.     04/16/2022    9:27 AM 04/11/2021    9:24 AM 04/10/2020   10:24 AM 02/04/2019    9:50 AM 12/20/2018    8:30 AM 12/17/2017    8:53 AM 12/12/2014    8:54 AM  Advanced Directives  Does Patient Have a Medical Advance Directive? No No No No No No No  Would patient like information on creating a medical advance directive? Yes (ED - Information included in AVS) Yes (ED - Information included in AVS) Yes (ED - Information included in AVS)  Yes (MAU/Ambulatory/Procedural Areas - Information given) Yes (MAU/Ambulatory/Procedural Areas - Information given) Yes - Educational materials given    Current Medications (verified) Outpatient Encounter Medications as of 01/20/2023  Medication Sig   Acetaminophen (TYLENOL 8 HOUR PO) Take by mouth.   amLODipine (NORVASC) 2.5 MG tablet TAKE 1 TABLET(2.5 MG) BY MOUTH DAILY   Ascorbic Acid (VITAMIN C) 500 MG tablet Take 1,000 mg by mouth daily.   aspirin 81 MG tablet Take 81 mg by mouth every other day.   co-enzyme Q-10 30 MG capsule Take 200 mg by mouth daily.   lisinopril-hydrochlorothiazide (ZESTORETIC) 20-12.5 MG tablet TAKE 1 TABLET BY MOUTH DAILY   Multiple Vitamin (MULTIVITAMIN)  capsule Take 1 capsule by mouth daily.   Omega-3 Fatty Acids (FISH OIL PO) Take 2 tablets by mouth daily.   pravastatin (PRAVACHOL) 20 MG tablet Take 1 tablet (20 mg total) by mouth every evening.   PROBIOTIC CAPS Take by mouth daily.   Psyllium (METAMUCIL PO) Take 19 g by mouth. As directed   albuterol (VENTOLIN HFA) 108 (90 Base) MCG/ACT inhaler Inhale 2 puffs into the lungs every 6 (six) hours as needed for wheezing or shortness of breath. (Patient not taking: Reported on 01/20/2023)   amoxicillin (AMOXIL) 875 MG tablet Take 1 tablet (875 mg total) by mouth 2 (two) times daily. (Patient not taking: Reported on 01/20/2023)   guaiFENesin (MUCINEX) 600 MG 12 hr tablet Take by mouth 2 (two) times daily. (Patient not taking: Reported on 04/16/2022)   tadalafil (CIALIS) 20 MG tablet Take 1 tablet (20 mg total) by mouth daily as needed for erectile dysfunction. (Patient not taking: Reported on 04/16/2022)   Facility-Administered Encounter Medications as of 01/20/2023  Medication   influenza  inactive virus vaccine (FLUZONE/FLUARIX) injection 0.5 mL    Allergies (verified) Patient has no known allergies.   History: Past Medical History:  Diagnosis Date   Cataract    had Bil   Cough    on and off   Dyslipidemia    History of colon polyps    HTN (hypertension)    Hyperlipidemia    Scoliosis    Past Surgical History:  Procedure Laterality Date   COLONOSCOPY  2010   [facility]   HERNIA REPAIR     right inguinal hernia   HIP SURGERY     right inguinal hernia     scoliosis     x2   TONSILLECTOMY     Family History  Problem Relation Age of Onset   Heart failure Mother    Heart attack Mother    Heart disease Mother    Cancer Father    Cancer Sister    Lung cancer Sister    Cancer Brother    Esophageal cancer Brother    Coronary artery disease Brother    Heart disease Brother    COPD Brother    Social History   Socioeconomic History   Marital status: Married    Spouse name: Not on  file   Number of children: Not on file   Years of education: Not on file   Highest education level: Not on file  Occupational History   Not on file  Tobacco Use   Smoking status: Never   Smokeless tobacco: Never  Vaping Use   Vaping Use: Never used  Substance and Sexual Activity   Alcohol use: No   Drug use: No   Sexual activity: Yes  Other Topics Concern   Not on file  Social History Narrative   Not on file   Social Determinants of Health   Financial Resource Strain: Low Risk  (01/20/2023)   Overall Financial Resource Strain (CARDIA)    Difficulty of Paying Living Expenses: Not hard at all  Food Insecurity: No Food Insecurity (01/20/2023)   Hunger Vital Sign    Worried About Running Out of Food in the Last Year: Never true    Ran Out of Food in the Last Year: Never true  Transportation Needs: No Transportation Needs (01/20/2023)   PRAPARE - Administrator, Civil Service (Medical): No    Lack of Transportation (Non-Medical): No  Physical Activity: Inactive (01/20/2023)   Exercise Vital Sign    Days of Exercise per Week: 0 days    Minutes of Exercise per Session: 0 min  Stress: No Stress Concern Present (01/20/2023)   [employer] of Occupational Health - Occupational Stress Questionnaire    Feeling of Stress : Not at all  Social Connections: Patient Declined (01/20/2023)   Social Connection and Isolation Panel [NHANES]    Frequency of Communication with Friends and Family: Patient declined    Frequency of Social Gatherings with Friends and Family: Patient declined    Attends Religious Services: Patient declined    Database administrator or Organizations: Patient declined    Attends Banker Meetings: Patient declined    Marital Status: Patient declined    Tobacco Counseling Counseling given: Not Answered   Clinical Intake:  Pre-visit preparation completed: Yes  Pain : No/denies pain     Nutritional Status: BMI of 19-24  Normal Nutritional  Risks: None Diabetes: No  How often do you need to have someone help you when you read instructions, pamphlets, or other written materials from your doctor or pharmacy?: 1 - Never  Interpreter Needed?: No  Information entered by :: NAllen LPN   Activities of Daily Living    01/20/2023   11:28 AM 04/16/2022    9:28 AM  In your present state of health, do you have any difficulty performing the following activities:  Hearing? 1 1  Comment has hearing aids   Vision? 0 0  Difficulty concentrating or making decisions? 0 0  Walking or climbing stairs? 0 0  Dressing or bathing? 0 0  Doing errands, shopping? 0 0  Preparing Food and eating ? N N  Using the Toilet? N N  In the past six months, have you accidently leaked urine? N N  Do you have problems with loss of bowel control? N N  Managing your Medications? N N  Managing your Finances? N N  Housekeeping or managing your Housekeeping? N N    Patient Care Team: Ronnald Nian, MD as PCP - General (Family Medicine) Pricilla Riffle, MD as PCP - Cardiology (Cardiology)  Indicate any recent Medical Services you may have received from other than [facility] providers in the past year (date may be approximate).     Assessment:   This is a routine wellness examination for Pj.  Hearing/Vision screen Hearing Screening - Comments:: Has hearing aids that are maintained Vision Screening - Comments:: Regular eye exams, [facility]  Dietary issues and exercise activities discussed:     Goals Addressed             This Visit's Progress    Patient Stated       01/20/2023, denies any health goals       Depression Screen    01/20/2023   11:35 AM 04/16/2022    9:28 AM 04/11/2021    9:25 AM 04/10/2020   10:27 AM 12/20/2018    8:08 AM 12/17/2017    8:27 AM 12/16/2016    8:31 AM  PHQ 2/9 Scores  PHQ - 2 Score 0 0 0 0 0 0 0  PHQ- 9 Score 0          Fall Risk    01/20/2023   11:34 AM 04/16/2022    9:28 AM 04/11/2021    9:24 AM 04/10/2020    10:27 AM 12/20/2018    8:08 AM  Fall Risk   Falls in the past year? 0 0 0 0 0  Number falls in past yr: 0 0 0    Injury with Fall? 0 0 0    Risk for fall due to : Medication side effect No Fall Risks No Fall Risks    Follow up Falls prevention discussed;Falls evaluation completed Falls evaluation completed Falls evaluation completed      MEDICARE RISK AT HOME:  Medicare Risk at Home - 01/20/23 1134     Any stairs in or around the home? No    If so, are there any without handrails? No    Home free of loose throw rugs in walkways, pet beds, electrical cords, etc? Yes    Adequate lighting in your home to reduce risk of falls? Yes    Life alert? No    Use of a cane, walker or w/c? No    Grab bars in the bathroom? Yes    Shower chair or bench in shower? No    Elevated toilet seat or a handicapped toilet? No             TIMED UP AND GO:  Was the test performed?  No    Cognitive Function:        01/20/2023   11:36 AM 04/16/2022    9:31 AM  6CIT Screen  What Year? 0 points 0 points  What month? 0 points 0 points  What time? 0 points 0 points  Count back from 20 0 points 0 points  Months in reverse 0 points  0 points  Repeat phrase 0 points 10 points  Total Score 0 points 10 points    Immunizations Immunization History  Administered Date(s) Administered   Fluad Quad(high Dose 65+) 05/02/2019, 04/10/2020, 04/11/2021, 05/09/2022   Influenza Split 05/20/2011, 04/23/2012   Influenza, High Dose Seasonal PF 04/21/2013, 05/11/2014, 03/28/2015, 05/01/2016, 05/05/2017, 04/23/2018   PFIZER(Purple Top)SARS-COV-2 Vaccination 09/21/2019, 10/19/2019, 04/19/2020   Pfizer Covid-19 Vaccine Bivalent Booster 28yrs & up 04/11/2021   Pneumococcal Conjugate-13 01/23/2014   Pneumococcal Polysaccharide-23 07/21/2007   Td 12/12/2014   Tdap 12/15/2003   Zoster Recombinant(Shingrix) 03/07/2019, 05/09/2019   Zoster, Live 07/21/2007    TDAP status: Up to date  Flu Vaccine status: Up to  date  Pneumococcal vaccine status: Up to date  Covid-19 vaccine status: Information provided on how to obtain vaccines.   Qualifies for Shingles Vaccine? Yes   Zostavax completed Yes   Shingrix Completed?: Yes  Screening Tests Health Maintenance  Topic Date Due   Pneumonia Vaccine 32+ Years old (3 of 3 - PPSV23 or PCV20) 01/24/2019   COVID-19 Vaccine (5 - 2023-24 season) 03/14/2022   INFLUENZA VACCINE  02/12/2023   Medicare Annual Wellness (AWV)  01/20/2024   DTaP/Tdap/Td (3 - Td or Tdap) 12/11/2024   Hepatitis C Screening  Completed   Zoster Vaccines- Shingrix  Completed   HPV VACCINES  Aged Out   Colonoscopy  Discontinued    Health Maintenance  Health Maintenance Due  Topic Date Due   Pneumonia Vaccine 22+ Years old (3 of 3 - PPSV23 or PCV20) 01/24/2019   COVID-19 Vaccine (5 - 2023-24 season) 03/14/2022    Colorectal cancer screening: No longer required.   Lung Cancer Screening: (Low Dose CT Chest recommended if Age 62-80 years, 20 pack-year currently smoking OR have quit w/in 15years.) does not qualify.   Lung Cancer Screening Referral: no  Additional Screening:  Hepatitis C Screening: does qualify; Completed 12/13/2015  Vision Screening: Recommended annual ophthalmology exams for early detection of glaucoma and other disorders of the eye. Is the patient up to date with their annual eye exam?  Yes  Who is the provider or what is the name of the office in which the patient attends annual eye exams? [facility] If pt is not established with a provider, would they like to be referred to a provider to establish care? No .   Dental Screening: Recommended annual dental exams for proper oral hygiene  Diabetic Foot Exam: n/a  Community Resource Referral / Chronic Care Management: CRR required this visit?  No   CCM required this visit?  No     Plan:     I have personally reviewed and noted the following in the patient's chart:   Medical and social  history Use of alcohol, tobacco or illicit drugs  Current medications and supplements including opioid prescriptions. Patient is not currently taking opioid prescriptions. Functional ability and status Nutritional status Physical activity Advanced directives List of other physicians Hospitalizations, surgeries, and ER visits in previous 12 months Vitals Screenings to include cognitive, depression, and falls Referrals and appointments  In addition, I have reviewed and discussed with patient certain preventive protocols, quality metrics, and best practice recommendations. A written personalized care plan for preventive services as well as general preventive health recommendations were provided to patient.     Barb Merino, LPN   [id number]/[id number]   After Visit Summary: (Pick Up) Due to this being a telephonic visit, with patients personalized plan was offered to patient and patient has  requested to Pick up at office.  Nurse Notes: none

## 2023-02-17 ENCOUNTER — Encounter: Payer: Self-pay | Admitting: Orthopaedic Surgery

## 2023-02-17 ENCOUNTER — Ambulatory Visit: Payer: PPO | Admitting: Orthopaedic Surgery

## 2023-02-17 DIAGNOSIS — M79641 Pain in right hand: Secondary | ICD-10-CM

## 2023-02-17 MED ORDER — DICLOFENAC SODIUM 75 MG PO TBEC: 75.0000 mg | DELAYED_RELEASE_TABLET | Freq: Two times a day (BID) | ORAL | 2 refills | Status: DC

## 2023-02-17 NOTE — Progress Notes (Signed)
Office Visit Note   Patient: Samuel DULSKI Sr.           Date of Birth: Jun 11, 1946           MRN: [id number] Visit Date: 02/17/2023              Requested by: Ronnald Nian, MD [address] [place],  [place] [address] PCP: Ronnald Nian, MD   Assessment & Plan: Visit Diagnoses:  1. Pain in right hand     Plan: [name] is a 77 year old gentleman with right middle finger pain probable osteoarthritis aggravation from injury.  I would like to place him on 2 weeks of diclofenac twice a day.  If he does not improve he will make an appointment with Dr. Denese Killings for further evaluation and treatment possibly cortisone injection if indicated.  Follow-Up Instructions: No follow-ups on file.   Orders:  No orders of the defined types were placed in this encounter.  No orders of the defined types were placed in this encounter.     Procedures: No procedures performed   Clinical Data: No additional findings.   Subjective: Chief Complaint  Patient presents with   Right Hand - Pain    Middle finger    HPI [name] is a very pleasant 77 year old gentleman here for evaluation of right middle finger pain for about a year.  Denies any triggering or locking.  He saw Dr. Ophelia Charter last year for this.  He got his middle finger caught in the trigger guard when he was operating a drill.  Since then he states that has not felt right.  He has pain mainly between the PIP and MCP joint.  Review of Systems  Constitutional: Negative.   HENT: Negative.    Eyes: Negative.   Respiratory: Negative.    Cardiovascular: Negative.   Gastrointestinal: Negative.   Endocrine: Negative.   Genitourinary: Negative.   Skin: Negative.   Allergic/Immunologic: Negative.   Neurological: Negative.   Hematological: Negative.   Psychiatric/Behavioral: Negative.    All other systems reviewed and are negative.    Objective: Vital Signs: There were no vitals taken for this visit.  Physical Exam Vitals  and nursing note reviewed.  Constitutional:      Appearance: He is well-developed.  Pulmonary:     Effort: Pulmonary effort is normal.  Abdominal:     Palpations: Abdomen is soft.  Skin:    General: Skin is warm.  Neurological:     Mental Status: He is alert and oriented to person, place, and time.  Psychiatric:        Behavior: Behavior normal.        Thought Content: Thought content normal.        Judgment: Judgment normal.     Ortho Exam Exam of the right long finger shows no locking or triggering.  Pain in the PIP and MCP joint. Specialty Comments:  No specialty comments available.  Imaging: No results found.   PMFS History: Patient Active Problem List   Diagnosis Date Noted   Impingement syndrome of left shoulder 09/19/2022   Hand strain, right, sequela 09/19/2022   Statin intolerance 04/16/2022   Glaucoma 08/20/2020   Hearing loss 03/30/2018   Idiopathic scoliosis 11/15/2012   Hyperlipidemia LDL goal <100 02/10/2009   Essential hypertension 02/10/2009   Past Medical History:  Diagnosis Date   Cataract    had Bil   Cough    on and off   Dyslipidemia    History of  colon polyps    HTN (hypertension)    Hyperlipidemia    Scoliosis     Family History  Problem Relation Age of Onset   Heart failure Mother    Heart attack Mother    Heart disease Mother    Cancer Father    Cancer Sister    Lung cancer Sister    Cancer Brother    Esophageal cancer Brother    Coronary artery disease Brother    Heart disease Brother    COPD Brother     Past Surgical History:  Procedure Laterality Date   COLONOSCOPY  2010   [facility]   HERNIA REPAIR     right inguinal hernia   HIP SURGERY     right inguinal hernia     scoliosis     x2   TONSILLECTOMY     Social History   Occupational History   Not on file  Tobacco Use   Smoking status: Never   Smokeless tobacco: Never  Vaping Use   Vaping status: Never Used  Substance and Sexual Activity   Alcohol use: No    Drug use: No   Sexual activity: Yes

## 2023-03-10 ENCOUNTER — Telehealth: Payer: Self-pay | Admitting: Family Medicine

## 2023-03-10 DIAGNOSIS — N529 Male erectile dysfunction, unspecified: Secondary | ICD-10-CM

## 2023-03-10 MED ORDER — TADALAFIL 20 MG PO TABS: 20.0000 mg | ORAL_TABLET | Freq: Every day | ORAL | 5 refills | Status: DC | PRN

## 2023-03-10 NOTE — Telephone Encounter (Signed)
Needs refill tadalafil 20 mg  send   [facility]

## 2023-03-19 ENCOUNTER — Other Ambulatory Visit: Payer: Self-pay | Admitting: Family Medicine

## 2023-03-19 DIAGNOSIS — N529 Male erectile dysfunction, unspecified: Secondary | ICD-10-CM

## 2023-03-19 NOTE — Telephone Encounter (Signed)
This was sent to [facility] last week-can you please re-send to [facility]. That is where patient wanted it sent. Thanks.

## 2023-03-23 ENCOUNTER — Telehealth (INDEPENDENT_AMBULATORY_CARE_PROVIDER_SITE_OTHER): Payer: PPO | Admitting: Medical

## 2023-03-23 ENCOUNTER — Encounter: Payer: Self-pay | Admitting: Medical

## 2023-03-23 VITALS — Temp 97.1°F | Ht 70.0 in | Wt 166.0 lb

## 2023-03-23 DIAGNOSIS — J988 Other specified respiratory disorders: Secondary | ICD-10-CM | POA: Diagnosis not present

## 2023-03-23 DIAGNOSIS — R051 Acute cough: Secondary | ICD-10-CM | POA: Diagnosis not present

## 2023-03-23 MED ORDER — HYDROCODONE BIT-HOMATROP MBR 5-1.5 MG/5ML PO SOLN: 5.0000 mL | Freq: Three times a day (TID) | ORAL | 0 refills | Status: AC | PRN

## 2023-03-23 NOTE — Progress Notes (Signed)
Subjective:     Patient ID: Samuel Glaze Sr., male   DOB: 1945/07/19, 77 y.o.   MRN: [id number]  This visit type was conducted due to national recommendations for restrictions regarding the COVID-19 Pandemic (e.g. social distancing) in an effort to limit this patient's exposure and mitigate transmission in our community.  Due to their co-morbid illnesses, this patient is at least at moderate risk for complications without adequate follow up.  This format is felt to be most appropriate for this patient at this time.    Documentation for virtual audio and video telecommunications through [facility] encounter:  The patient was located at home. The provider was located in the office. The patient did consent to this visit and is aware of possible charges through their insurance for this visit.  The other persons participating in this telemedicine service were wife and daughter Time spent on call was 20 minutes and in review of previous records 20 minutes total.  This virtual service is not related to other E/M service within previous 7 days.   HPI Chief Complaint  Patient presents with   Cough    VIRTUAL cough, congestion started last Wed.   Virtual consult for illness.  Symptoms started about 6 days ago.  He has had cough, mild sore throat, runny nose, sneezing, congestion, body aches and chills and headache.  No shortness of breath or wheezing.  No nausea vomiting or diarrhea.  He does feel some better improving compared to last week.  Using salt water gargles, TheraFlu and cough syrup over-the-counter.  No other aggravating or relieving factors. No other complaint.  Past Medical History:  Diagnosis Date   Cataract    had Bil   Cough    on and off   Dyslipidemia    History of colon polyps    HTN (hypertension)    Hyperlipidemia    Scoliosis    Current Outpatient Medications on File Prior to Visit  Medication Sig Dispense Refill   Acetaminophen (TYLENOL 8 HOUR PO) Take by mouth.      amLODipine (NORVASC) 2.5 MG tablet TAKE 1 TABLET(2.5 MG) BY MOUTH DAILY 90 tablet 3   Ascorbic Acid (VITAMIN C) 500 MG tablet Take 1,000 mg by mouth daily.     aspirin 81 MG tablet Take 81 mg by mouth every other day.     co-enzyme Q-10 30 MG capsule Take 200 mg by mouth daily.     lisinopril-hydrochlorothiazide (ZESTORETIC) 20-12.5 MG tablet TAKE 1 TABLET BY MOUTH DAILY 90 tablet 1   Multiple Vitamin (MULTIVITAMIN) capsule Take 1 capsule by mouth daily.     Omega-3 Fatty Acids (FISH OIL PO) Take 2 tablets by mouth daily.     Phenylephrine-DM (THERAFLU COLD/COUGH DAYTIME PO) Take 20 mLs by mouth as needed.     pravastatin (PRAVACHOL) 20 MG tablet Take 1 tablet (20 mg total) by mouth every evening. 30 tablet 11   PROBIOTIC CAPS Take by mouth daily.     Psyllium (METAMUCIL PO) Take 19 g by mouth. As directed     albuterol (VENTOLIN HFA) 108 (90 Base) MCG/ACT inhaler Inhale 2 puffs into the lungs every 6 (six) hours as needed for wheezing or shortness of breath. (Patient not taking: Reported on 03/23/2023) 8 g 0   diclofenac (VOLTAREN) 75 MG EC tablet Take 1 tablet (75 mg total) by mouth 2 (two) times daily. (Patient not taking: Reported on 03/23/2023) 30 tablet 2   guaiFENesin (MUCINEX) 600 MG 12 hr tablet Take by  mouth 2 (two) times daily. (Patient not taking: Reported on 03/23/2023)     tadalafil (CIALIS) 20 MG tablet TAKE ONE TABLET BY MOUTH DAILY AS NEEDED for erectile dysfunction (Patient not taking: Reported on 03/23/2023) 30 tablet 5   Current Facility-Administered Medications on File Prior to Visit  Medication Dose Route Frequency Provider Last Rate Last Admin   influenza  inactive virus vaccine (FLUZONE/FLUARIX) injection 0.5 mL  0.5 mL Intramuscular Once Ronnald Nian, MD         Review of Systems As in subjective    Objective:   Physical Exam Due to coronavirus pandemic stay at home measures, patient visit was virtual and they were not examined in person.   Gen: wd, wn, nad No  labored breathign or wheezing     Assessment:     Encounter Diagnoses  Name Primary?   Acute cough Yes   Respiratory tract infection        Plan:     We discussed symptoms and concerns.  He has not done a COVID test but his daughter and his wife both had negative COVID test at home today.  Offered other evaluation in person but they declined.  Advise he continue to rest, hydrate well, can use Hycodan below for worse cough if needed, otherwise continue Tylenol for fever not feeling well, can use Mucinex for thick mucus or Benadryl for clear mucus and runny nose.  If not greatly improved over the next 2 days or if other new or worse symptoms then get reevaluated.  Denahi was seen today for cough.  Diagnoses and all orders for this visit:  Acute cough  Respiratory tract infection  Other orders -     HYDROcodone bit-homatropine (HYCODAN) 5-1.5 MG/5ML syrup; Take 5 mLs by mouth every 8 (eight) hours as needed for up to 5 days for cough.  F/u prn

## 2023-03-25 ENCOUNTER — Telehealth: Payer: Self-pay | Admitting: Family Medicine

## 2023-03-25 ENCOUNTER — Other Ambulatory Visit: Payer: Self-pay | Admitting: Medical

## 2023-03-25 MED ORDER — AMOXICILLIN-POT CLAVULANATE 875-125 MG PO TABS: 1.0000 | ORAL_TABLET | Freq: Two times a day (BID) | ORAL | 0 refills | Status: DC

## 2023-03-25 NOTE — Telephone Encounter (Signed)
Wife called & states Pt still coughing bad, now yellow & green, hard to get anything out, can't get his breath, said one of his lungs is smaller than the other.  Said she and Velna Hatchet seem to be getting better on the antibiotic but Kailen's cough isn't.  She would like antibiotic called into [facility].  Shane sent this is & I called wife back & let her know.

## 2023-03-30 ENCOUNTER — Telehealth: Payer: Self-pay

## 2023-03-30 NOTE — Telephone Encounter (Signed)
Pt's wife states you prescribed a syrup and antibiotic. Pt still has continuous coughing and is unable to get rid of mucus. Wants to know if there is any other rx or options to help.

## 2023-03-31 NOTE — Telephone Encounter (Signed)
Pt states he would like to wait to come in. States he is taking antibiotics.

## 2023-04-07 ENCOUNTER — Other Ambulatory Visit: Payer: Self-pay | Admitting: Family Medicine

## 2023-04-07 DIAGNOSIS — I10 Essential (primary) hypertension: Secondary | ICD-10-CM

## 2023-04-22 DIAGNOSIS — M1990 Unspecified osteoarthritis, unspecified site: Secondary | ICD-10-CM | POA: Insufficient documentation

## 2023-04-22 DIAGNOSIS — N5201 Erectile dysfunction due to arterial insufficiency: Secondary | ICD-10-CM | POA: Insufficient documentation

## 2023-04-23 ENCOUNTER — Ambulatory Visit (INDEPENDENT_AMBULATORY_CARE_PROVIDER_SITE_OTHER): Payer: PPO | Admitting: Family Medicine

## 2023-04-23 ENCOUNTER — Encounter: Payer: Self-pay | Admitting: Family Medicine

## 2023-04-23 VITALS — BP 118/78 | HR 67 | Ht 69.0 in | Wt 170.2 lb

## 2023-04-23 DIAGNOSIS — Z23 Encounter for immunization: Secondary | ICD-10-CM

## 2023-04-23 DIAGNOSIS — H4010X2 Unspecified open-angle glaucoma, moderate stage: Secondary | ICD-10-CM | POA: Diagnosis not present

## 2023-04-23 DIAGNOSIS — H833X3 Noise effects on inner ear, bilateral: Secondary | ICD-10-CM | POA: Diagnosis not present

## 2023-04-23 DIAGNOSIS — E785 Hyperlipidemia, unspecified: Secondary | ICD-10-CM | POA: Diagnosis not present

## 2023-04-23 DIAGNOSIS — N5201 Erectile dysfunction due to arterial insufficiency: Secondary | ICD-10-CM

## 2023-04-23 DIAGNOSIS — I10 Essential (primary) hypertension: Secondary | ICD-10-CM

## 2023-04-23 DIAGNOSIS — M1990 Unspecified osteoarthritis, unspecified site: Secondary | ICD-10-CM

## 2023-04-23 DIAGNOSIS — Z0000 Encounter for general adult medical examination without abnormal findings: Secondary | ICD-10-CM | POA: Diagnosis not present

## 2023-04-23 DIAGNOSIS — M4125 Other idiopathic scoliosis, thoracolumbar region: Secondary | ICD-10-CM

## 2023-04-23 DIAGNOSIS — N529 Male erectile dysfunction, unspecified: Secondary | ICD-10-CM

## 2023-04-23 DIAGNOSIS — Z789 Other specified health status: Secondary | ICD-10-CM | POA: Diagnosis not present

## 2023-04-23 MED ORDER — LISINOPRIL-HYDROCHLOROTHIAZIDE 20-12.5 MG PO TABS
1.0000 | ORAL_TABLET | Freq: Every day | ORAL | 1 refills | Status: DC
Start: 2023-04-23 — End: 2023-10-02

## 2023-04-23 MED ORDER — AMLODIPINE BESYLATE 2.5 MG PO TABS
ORAL_TABLET | ORAL | 3 refills | Status: DC
Start: 2023-04-23 — End: 2023-07-09

## 2023-04-23 NOTE — Progress Notes (Addendum)
Complete physical exam  Patient: Samuel CARREIRA Sr.   DOB: Feb 20, 1946   77 y.o. Male  MRN: [id number]  Subjective:    Chief Complaint  Patient presents with   Hand Pain    Samuel RADILLA Sr. is a 77 y.o. male who presents today for a complete physical exam. He reports consuming a general diet. Gym/ health club routine includes treadmill. He generally feels well. He reports sleeping well. He does have additional problems to discuss today.  He injured his right third finger while using a drill and was seen by Ortho care.  Apparently the x-rays were negative but he still has difficulty with pain when he grips anything.  He has been using diclofenac on an as-needed basis.  He continues to be seen by ophthalmology for his glaucoma.  Does have statin intolerance.  He apparently has tried several different statins without much success.  Continues on his blood pressure medications.  Does have hearing loss but does not like to use hearing aids.  He does use Cialis on an as-needed basis.   Most recent fall risk assessment:    01/20/2023   11:34 AM  Fall Risk   Falls in the past year? 0  Number falls in past yr: 0  Injury with Fall? 0  Risk for fall due to : Medication side effect  Follow up Falls prevention discussed;Falls evaluation completed     Most recent depression screenings:    01/20/2023   11:35 AM 04/16/2022    9:28 AM  PHQ 2/9 Scores  PHQ - 2 Score 0 0  PHQ- 9 Score 0   The 10-year ASCVD risk score (Arnett DK, et al., 2019) is: 25.5%   Values used to calculate the score:     Age: 47 years     Sex: Male     Is Non-Hispanic African American: No     Diabetic: No     Tobacco smoker: No     Systolic Blood Pressure: 118 mmHg     Is BP treated: Yes     HDL Cholesterol: 54 mg/dL     Total Cholesterol: 187 mg/dL   Vision:Within last year and Dental: No current dental problems and Last dental visit: August 2024    Patient Care Team: Ronnald Nian, MD as PCP - General (Family  Medicine) Pricilla Riffle, MD as PCP - Cardiology (Cardiology)   Outpatient Medications Prior to Visit  Medication Sig   Ascorbic Acid (VITAMIN C) 500 MG tablet Take 1,000 mg by mouth daily.   aspirin 81 MG tablet Take 81 mg by mouth every other day.   co-enzyme Q-10 30 MG capsule Take 200 mg by mouth daily.   Multiple Vitamin (MULTIVITAMIN) capsule Take 1 capsule by mouth daily.   Omega-3 Fatty Acids (FISH OIL PO) Take 2 tablets by mouth daily.   PROBIOTIC CAPS Take by mouth daily.   Psyllium (METAMUCIL PO) Take 19 g by mouth. As directed   Red Yeast Rice 600 MG TABS Take 2 tablets by mouth daily.   [DISCONTINUED] amLODipine (NORVASC) 2.5 MG tablet TAKE 1 TABLET(2.5 MG) BY MOUTH DAILY   Acetaminophen (TYLENOL 8 HOUR PO) Take by mouth. (Patient not taking: Reported on 04/23/2023)   albuterol (VENTOLIN HFA) 108 (90 Base) MCG/ACT inhaler Inhale 2 puffs into the lungs every 6 (six) hours as needed for wheezing or shortness of breath. (Patient not taking: Reported on 03/23/2023)   amoxicillin-clavulanate (AUGMENTIN) 875-125 MG tablet Take 1 tablet by  mouth 2 (two) times daily. (Patient not taking: Reported on 04/23/2023)   diclofenac (VOLTAREN) 75 MG EC tablet Take 1 tablet (75 mg total) by mouth 2 (two) times daily. (Patient not taking: Reported on 03/23/2023)   guaiFENesin (MUCINEX) 600 MG 12 hr tablet Take by mouth 2 (two) times daily. (Patient not taking: Reported on 03/23/2023)   Phenylephrine-DM (THERAFLU COLD/COUGH DAYTIME PO) Take 20 mLs by mouth as needed. (Patient not taking: Reported on 04/23/2023)   tadalafil (CIALIS) 20 MG tablet TAKE ONE TABLET BY MOUTH DAILY AS NEEDED for erectile dysfunction (Patient not taking: Reported on 03/23/2023)   [DISCONTINUED] lisinopril-hydrochlorothiazide (ZESTORETIC) 20-12.5 MG tablet TAKE 1 TABLET BY MOUTH DAILY   [DISCONTINUED] pravastatin (PRAVACHOL) 20 MG tablet Take 1 tablet (20 mg total) by mouth every evening. (Patient not taking: Reported on 04/23/2023)    Facility-Administered Medications Prior to Visit  Medication Dose Route Frequency Provider   influenza  inactive virus vaccine (FLUZONE/FLUARIX) injection 0.5 mL  0.5 mL Intramuscular Once Ronnald Nian, MD    Review of Systems  All other systems reviewed and are negative.         Objective:     The 10-year ASCVD risk score (Arnett DK, et al., 2019) is: 26.7%   Values used to calculate the score:     Age: 78 years     Sex: Male     Is Non-Hispanic African American: No     Diabetic: No     Tobacco smoker: No     Systolic Blood Pressure: 118 mmHg     Is BP treated: Yes     HDL Cholesterol: 58 mg/dL     Total Cholesterol: 238 mg/dL   Physical Exam  Alert and in no distress. Tympanic membranes and canals are normal. Pharyngeal area is normal. Neck is supple without adenopathy or thyromegaly. Cardiac exam shows a regular sinus rhythm without murmurs or gallops. Lungs are clear to auscultation.  Back does show scoliotic changes.      Assessment & Plan:    Routine Health Maintenance and Physical Exam  Immunization History  Administered Date(s) Administered   Fluad Quad(high Dose 65+) 05/02/2019, 04/10/2020, 04/11/2021, 05/09/2022   Influenza Split 05/20/2011, 04/23/2012   Influenza, High Dose Seasonal PF 04/21/2013, 05/11/2014, 03/28/2015, 05/01/2016, 05/05/2017, 04/23/2018   PFIZER(Purple Top)SARS-COV-2 Vaccination 09/21/2019, 10/19/2019, 04/19/2020   Pfizer Covid-19 Vaccine Bivalent Booster 53yrs & up 04/11/2021   Pneumococcal Conjugate-13 01/23/2014   Pneumococcal Polysaccharide-23 07/21/2007   Td 12/12/2014   Tdap 12/15/2003   Zoster Recombinant(Shingrix) 03/07/2019, 05/09/2019   Zoster, Live 07/21/2007    Health Maintenance  Topic Date Due   Pneumonia Vaccine 59+ Years old (3 of 3 - PPSV23 or PCV20) 01/24/2019   INFLUENZA VACCINE  02/12/2023   COVID-19 Vaccine (5 - 2023-24 season) 05/09/2023 (Originally 03/15/2023)   Medicare Annual Wellness (AWV)   01/20/2024   DTaP/Tdap/Td (3 - Td or Tdap) 12/11/2024   Hepatitis C Screening  Completed   Zoster Vaccines- Shingrix  Completed   HPV VACCINES  Aged Out   Colonoscopy  Discontinued    Discussed health benefits of physical activity, and encouraged him to engage in regular exercise appropriate for his age and condition.  Problem List Items Addressed This Visit     Idiopathic scoliosis (Chronic)   Arthritis   Erectile dysfunction due to arterial insufficiency   Relevant Medications   lisinopril-hydrochlorothiazide (ZESTORETIC) 20-12.5 MG tablet   amLODipine (NORVASC) 2.5 MG tablet   Essential hypertension   Relevant Medications  lisinopril-hydrochlorothiazide (ZESTORETIC) 20-12.5 MG tablet   amLODipine (NORVASC) 2.5 MG tablet   Other Relevant Orders   CBC with Differential/Platelet   Comprehensive metabolic panel   Glaucoma   Hearing loss   Hyperlipidemia LDL goal <100   Relevant Medications   lisinopril-hydrochlorothiazide (ZESTORETIC) 20-12.5 MG tablet   amLODipine (NORVASC) 2.5 MG tablet   Other Relevant Orders   Lipid panel   Statin intolerance   Other Visit Diagnoses     Routine general medical examination at a health care facility    -  Primary   Relevant Orders   CBC with Differential/Platelet   Comprehensive metabolic panel   Lipid panel   Need for influenza vaccination       Relevant Orders   Flu Vaccine Trivalent High Dose (Fluad)      Discussed placing him on Zetia but he is not interested in trying any other medicine for his cholesterol.  I then discussed the pain he is having in his hand and recommend Tylenol first and if no improvement with that he can then use an NSAID and I will call in diclofenac if he wants.  He will continue on Cialis on an as-needed basis.  Continue on his blood pressure medications.    Sharlot Gowda, MD

## 2023-04-24 LAB — COMPREHENSIVE METABOLIC PANEL
ALT: 23 [IU]/L (ref 0–44)
AST: 25 [IU]/L (ref 0–40)
Albumin: 4.4 g/dL (ref 3.8–4.8)
Alkaline Phosphatase: 61 [IU]/L (ref 44–121)
BUN/Creatinine Ratio: 27 — ABNORMAL HIGH (ref 10–24)
BUN: 17 mg/dL (ref 8–27)
Bilirubin Total: 0.9 mg/dL (ref 0.0–1.2)
CO2: 26 mmol/L (ref 20–29)
Calcium: 9.9 mg/dL (ref 8.6–10.2)
Chloride: 96 mmol/L (ref 96–106)
Creatinine, Ser: 0.62 mg/dL — ABNORMAL LOW (ref 0.76–1.27)
Globulin, Total: 2.7 g/dL (ref 1.5–4.5)
Glucose: 102 mg/dL — ABNORMAL HIGH (ref 70–99)
Potassium: 4.3 mmol/L (ref 3.5–5.2)
Sodium: 136 mmol/L (ref 134–144)
Total Protein: 7.1 g/dL (ref 6.0–8.5)
eGFR: 99 mL/min/{1.73_m2} (ref >59)

## 2023-04-24 LAB — CBC WITH DIFFERENTIAL/PLATELET
Basophils Absolute: 0.1 10*3/uL (ref 0.0–0.2)
Basos: 1 %
EOS (ABSOLUTE): 0.1 10*3/uL (ref 0.0–0.4)
Eos: 3 %
Hematocrit: 44.2 % (ref 37.5–51.0)
Hemoglobin: 14.8 g/dL (ref 13.0–17.7)
Immature Grans (Abs): 0 10*3/uL (ref 0.0–0.1)
Immature Granulocytes: 0 %
Lymphocytes Absolute: 1.6 10*3/uL (ref 0.7–3.1)
Lymphs: 33 %
MCH: 30.1 pg (ref 26.6–33.0)
MCHC: 33.5 g/dL (ref 31.5–35.7)
MCV: 90 fL (ref 79–97)
Monocytes Absolute: 0.5 10*3/uL (ref 0.1–0.9)
Monocytes: 10 %
Neutrophils Absolute: 2.5 10*3/uL (ref 1.4–7.0)
Neutrophils: 53 %
Platelets: 273 10*3/uL (ref 150–450)
RBC: 4.92 x10E6/uL (ref 4.14–5.80)
RDW: 12.1 % (ref 11.6–15.4)
WBC: 4.7 10*3/uL (ref 3.4–10.8)

## 2023-04-24 LAB — LIPID PANEL
Chol/HDL Ratio: 4.1 {ratio} (ref 0.0–5.0)
Cholesterol, Total: 238 mg/dL — ABNORMAL HIGH (ref 100–199)
HDL: 58 mg/dL (ref >39)
LDL Chol Calc (NIH): 166 mg/dL — ABNORMAL HIGH (ref 0–99)
Triglycerides: 82 mg/dL (ref 0–149)
VLDL Cholesterol Cal: 14 mg/dL (ref 5–40)

## 2023-05-04 ENCOUNTER — Encounter: Payer: Self-pay | Admitting: Internal Medicine

## 2023-05-04 NOTE — Telephone Encounter (Signed)
Error

## 2023-07-07 ENCOUNTER — Other Ambulatory Visit: Payer: Self-pay | Admitting: Internal Medicine

## 2023-07-07 DIAGNOSIS — I10 Essential (primary) hypertension: Secondary | ICD-10-CM

## 2023-07-09 ENCOUNTER — Telehealth: Payer: Self-pay | Admitting: Internal Medicine

## 2023-07-09 DIAGNOSIS — I10 Essential (primary) hypertension: Secondary | ICD-10-CM

## 2023-07-09 MED ORDER — AMLODIPINE BESYLATE 2.5 MG PO TABS: ORAL_TABLET | ORAL | 0 refills | Status: DC

## 2023-07-09 NOTE — Telephone Encounter (Signed)
Pt's medication was sent to pt's pharmacy as requested. Confirmation received.  °

## 2023-07-09 NOTE — Telephone Encounter (Signed)
*  STAT* If patient is at the pharmacy, call can be transferred to refill team.   1. Which medications need to be refilled? (please list name of each medication and dose if known)   amLODipine (NORVASC) 2.5 MG tablet    2. Which pharmacy/location (including street and city if local pharmacy) is medication to be sent to? [id number] - [place], [place] - [address] AT [address]   3. Do they need a 30 day or 90 day supply? 90   Patient only has 6 days left.   Requesting call back to discuss refill request.

## 2023-07-13 ENCOUNTER — Telehealth: Payer: Self-pay | Admitting: Family Medicine

## 2023-07-13 NOTE — Telephone Encounter (Signed)
Pt wife called, please call re lab results, states he never received results

## 2023-07-14 MED ORDER — EZETIMIBE 10 MG PO TABS: 10.0000 mg | ORAL_TABLET | Freq: Every day | ORAL | 3 refills | Status: DC

## 2023-07-14 NOTE — Progress Notes (Signed)
 Cardiology Office Note:  .   Date:  07/16/2023  ID:  Samuel LELON Elbe Sr., DOB 1945-12-14, MRN [id number] PCP: Joyce Norleen BROCKS, MD  [facility] Providers Cardiologist:  Vina Gull, MD {  History of Present Illness: .   [name] is a 77 y.o. male with a past medical history of hypertension, hyperlipidemia and glaucoma as well as CAD found on coronary CT scan 11/22/2020 (calcium score of 99.1 placing in the 35th percentile) he here for follow-up appointment.  Was seen by one of our clinical pharmacist in the lipid clinic.  Last lipid panel showed LDL of 120.  Given statin intolerance was referred for further medication management.  He reported no chest pain, palpitations, myalgias, or fatigue at that appointment which was 05/19/2022.  He had severe myalgias with statins.  Lots of education was provided on the meaning of his lipid panel and coronary calcium score.  Patient initially declined any changes in therapy because he feels that his risk is overall very low but eventually agreed to trial pravastatin  20 mg at bedtime and additional lifestyle modifications.  Plan to recheck lipid panel 2 months after.  Today, he presents with a history of coronary artery disease, for a follow-up visit. He reports discontinuing his cholesterol medication due to side effects including muscle aches and pains. He has been trying to manage his cholesterol through diet and exercise, but admits he has not been as diligent as he should be. He is now making a more concerted effort to improve his diet and increase his physical activity, including walking and using a treadmill. He is hopeful that these lifestyle changes will lead to an improvement in his cholesterol levels.  The patient also reports that his blood pressure has been better in the past week. He denies any chest pain or shortness of breath. He has a history of a right bundle branch block, but his current EKG does not show this. He had a coronary CT  scan in 2022 that showed some disease, but not enough to require a stent.  We discussed Nexlizet and other injectable options but he was not interested in hearing about those options and prefer to change diet and exercise instead. Currently not on anything for cholesterol control.  Reports no shortness of breath nor dyspnea on exertion. Reports no chest pain, pressure, or tightness. No edema, orthopnea, PND. Reports no palpitations.   Discussed the use of AI scribe software for clinical note transcription with the patient, who gave verbal consent to proceed.  ROS: pertinent ROS in HPI  Studies Reviewed: SABRA   EKG Interpretation Date/Time:  Thursday July 16 2023 08:02:38 EST Ventricular Rate:  62 PR Interval:  134 QRS Duration:  134 QT Interval:  420 QTC Calculation: 426 R Axis:   67  Text Interpretation: Normal sinus rhythm Non-specific intra-ventricular conduction block Septal infarct , age undetermined When compared with ECG of 18-Mar-2007 08:00, Non-specific intra-ventricular conduction block has replaced Right bundle branch block Septal infarct is now Present Confirmed by Lucien Blanc [phone]) on 07/16/2023 8:12:31 AM    Coronary CT angiogram   2022   Aorta: Normal size. Ascending aorta 3.0 cm. No calcifications. No dissection.   Aortic Valve:  Trileaflet.  No calcifications.   Coronary Arteries:  Normal coronary origin.  Right dominance.   RCA is a large dominant artery that gives rise to PDA and PLVB. There is no plaque.   Left main is a large artery that gives rise to  LAD, LCX, and RI arteries.   LAD is a large vessel that has minimal (<25%) calcified plaque. There are two tiny diagonal vessels without plaque.   LCX is a non-dominant artery that gives rise to one large OM1 branch. There is minimal (<25%) calcified plaque in the LCX proximally. There is a tiny OM1 and large OM2 without plaque.   RI is a tiny vessel without plaque.   Coronary Calcium Score:   Left  main: 0   Left anterior descending artery: 37.2   Left circumflex artery: 61.9   Right coronary artery: 0   Total: 99.1   Percentile: 35th   Other findings:   Normal pulmonary vein drainage into the left atrium.   Normal let atrial appendage without a thrombus.   Normal size of the pulmonary artery.   IMPRESSION: 1. Coronary calcium score of 99.1 This was 35th percentile for age-, race-, and sex-matched controls.   2. Normal coronary origin with right dominance.   3. There is minimal (<25%) plaque in the LAD and LCX.  CAD-RADS 1.   4.  Consider non-cardiac causes of exertional dyspnea.       Physical Exam:   VS:  BP 138/76   Pulse 78   Ht 5' 9 (1.753 m)   Wt 172 lb 3.2 oz (78.1 kg)   SpO2 95%   BMI 25.43 kg/m    Wt Readings from Last 3 Encounters:  07/16/23 172 lb 3.2 oz (78.1 kg)  04/23/23 170 lb 3.2 oz (77.2 kg)  03/23/23 166 lb (75.3 kg)    GEN: Well nourished, well developed in no acute distress NECK: No JVD; No carotid bruits CARDIAC: RRR, no murmurs, rubs, gallops RESPIRATORY:  Clear to auscultation without rales, wheezing or rhonchi  ABDOMEN: Soft, non-tender, non-distended EXTREMITIES:  No edema; No deformity   ASSESSMENT AND PLAN: .    Coronary Artery Disease Stable with no new symptoms. EKG shows normal sinus rhythm at 62 beats per minute. No evidence of right bundle branch block as previously noted. -dietary and exercise changes discussed and encouraged -Continue current management.  Hyperlipidemia LDL of 166 in October 2024, which is above the target of less than 70 due to existing coronary artery disease. Patient experienced side effects with statin therapy and is currently attempting lifestyle modifications. -Plan to recheck lipid panel in April 2025 to assess response to lifestyle modifications. -Discussed potential future medication options including Nexlizet if lifestyle modifications are insufficient. -Provide patient with literature on  lipid-lowering diet.  Hypertension Stable with improved blood pressure readings. -Continue current management.  General Health Maintenance. -Plan to arrange labs for patient.     Dispo: He can follow-up in 6 months with Dr. Okey  Signed, Orren LOISE Fabry, PA-C

## 2023-07-14 NOTE — Addendum Note (Signed)
Addended by: Ronnald Nian on: 07/14/2023 12:57 PM   Modules accepted: Orders

## 2023-07-16 ENCOUNTER — Other Ambulatory Visit: Payer: Self-pay | Admitting: *Deleted

## 2023-07-16 ENCOUNTER — Encounter: Payer: Self-pay | Admitting: Physician Assistant

## 2023-07-16 ENCOUNTER — Ambulatory Visit: Payer: PPO | Attending: Physician Assistant | Admitting: Physician Assistant

## 2023-07-16 VITALS — BP 138/76 | HR 78 | Ht 69.0 in | Wt 172.2 lb

## 2023-07-16 DIAGNOSIS — E785 Hyperlipidemia, unspecified: Secondary | ICD-10-CM

## 2023-07-16 DIAGNOSIS — I2510 Atherosclerotic heart disease of native coronary artery without angina pectoris: Secondary | ICD-10-CM

## 2023-07-16 DIAGNOSIS — Z789 Other specified health status: Secondary | ICD-10-CM | POA: Diagnosis not present

## 2023-07-16 DIAGNOSIS — I10 Essential (primary) hypertension: Secondary | ICD-10-CM

## 2023-07-16 NOTE — Patient Instructions (Signed)
 Medication Instructions:  Your physician recommends that you continue on your current medications as directed. Please refer to the Current Medication list given to you today.  *If you need a refill on your cardiac medications before your next appointment, please call your pharmacy*   Lab Work: 3 MONTHS GO TO A [facility] NEAR YOU, FASTING, FOR: LIPID  If you have labs (blood work) drawn today and your tests are completely normal, you will receive your results only by: MyChart Message (if you have MyChart) OR A paper copy in the mail If you have any lab test that is abnormal or we need to change your treatment, we will call you to review the results.   Testing/Procedures: None ordered   Follow-Up: At [facility], you and your health needs are our priority.  As part of our continuing mission to provide you with exceptional heart care, we have created designated Provider Care Teams.  These Care Teams include your primary Cardiologist (physician) and Advanced Practice Providers (APPs -  Physician Assistants and Nurse Practitioners) who all work together to provide you with the care you need, when you need it.  We recommend signing up for the patient portal called MyChart.  Sign up information is provided on this After Visit Summary.  MyChart is used to connect with patients for Virtual Visits (Telemedicine).  Patients are able to view lab/test results, encounter notes, upcoming appointments, etc.  Non-urgent messages can be sent to your provider as well.   To learn more about what you can do with MyChart, go to [email or web address].    Your next appointment:   6 month(s)  Provider:   Vina Gull, MD     Other Instructions

## 2023-09-01 DIAGNOSIS — H40013 Open angle with borderline findings, low risk, bilateral: Secondary | ICD-10-CM | POA: Diagnosis not present

## 2023-09-01 DIAGNOSIS — H02834 Dermatochalasis of left upper eyelid: Secondary | ICD-10-CM | POA: Diagnosis not present

## 2023-09-01 DIAGNOSIS — H04123 Dry eye syndrome of bilateral lacrimal glands: Secondary | ICD-10-CM | POA: Diagnosis not present

## 2023-09-01 DIAGNOSIS — H43811 Vitreous degeneration, right eye: Secondary | ICD-10-CM | POA: Diagnosis not present

## 2023-09-01 DIAGNOSIS — H02831 Dermatochalasis of right upper eyelid: Secondary | ICD-10-CM | POA: Diagnosis not present

## 2023-09-01 DIAGNOSIS — D3132 Benign neoplasm of left choroid: Secondary | ICD-10-CM | POA: Diagnosis not present

## 2023-09-08 ENCOUNTER — Encounter: Payer: Self-pay | Admitting: Internal Medicine

## 2023-10-02 ENCOUNTER — Other Ambulatory Visit: Payer: Self-pay | Admitting: Family Medicine

## 2023-10-02 DIAGNOSIS — I10 Essential (primary) hypertension: Secondary | ICD-10-CM

## 2023-11-09 DIAGNOSIS — E785 Hyperlipidemia, unspecified: Secondary | ICD-10-CM | POA: Diagnosis not present

## 2023-11-10 LAB — LIPID PANEL
Chol/HDL Ratio: 3.5 ratio (ref 0.0–5.0)
Cholesterol, Total: 183 mg/dL (ref 100–199)
HDL: 53 mg/dL (ref >39)
LDL Chol Calc (NIH): 108 mg/dL — ABNORMAL HIGH (ref 0–99)
Triglycerides: 122 mg/dL (ref 0–149)
VLDL Cholesterol Cal: 22 mg/dL (ref 5–40)

## 2023-11-11 NOTE — Telephone Encounter (Signed)
 Copied from CRM [id number]. Topic: Clinical - Lab/Test Results >> Nov 11, 2023  1:31 PM Loreda Rodriguez T wrote: Reason for CRM: patients spouse called to discuss the lab results. Please f/u with spouse.

## 2023-11-28 NOTE — Progress Notes (Signed)
 Cardiology Office Note   Date:  11/30/2023   ID:  Samuel Shanks Sr., DOB 1945/10/02, MRN [id number]  PCP:  Watson Hacking, MD  Cardiologist:   Ola Berger, MD   Pt presents for follow up of CAD, lipids and HTN    History of Present Illness: Samuel MALKIEWICZ Sr. is a 78 y.o. male with a history of HTN and HL    I saw hiim back in 2022.  At that time he felt he had some increased SOB with activity   Set up for a CT coronary angiogram  (see below)    CT showed no Ca score of 99.1.       I saw the pt in 2023  He was seen by Lannette Pitter in Jan 2025  He reported stopping chol medicine due to aches. Trying diet   Takes Red Yeast Rice The pt has really worked on diet   Eating whole food, minimal processed foods     His weight is down over 10 lbs   The pt denies CP  Breathing is good   He is very active  Walks about 40 min per day        Current Meds  Medication Sig   Acetaminophen (TYLENOL 8 HOUR PO) Take by mouth.   amLODipine  (NORVASC ) 2.5 MG tablet TAKE 1 TABLET(2.5 MG) BY MOUTH DAILY   Ascorbic Acid (VITAMIN C) 500 MG tablet Take 1,000 mg by mouth daily.   aspirin 81 MG tablet Take 81 mg by mouth every other day.   co-enzyme Q-10 30 MG capsule Take 100 mg by mouth daily.   ezetimibe  (ZETIA ) 10 MG tablet Take 1 tablet (10 mg total) by mouth daily.   lisinopril -hydrochlorothiazide  (ZESTORETIC ) 20-12.5 MG tablet TAKE 1 TABLET BY MOUTH DAILY   Multiple Vitamin (MULTIVITAMIN) capsule Take 1 capsule by mouth daily.   Omega-3 Fatty Acids (FISH OIL PO) Take 2 tablets by mouth daily.   PROBIOTIC CAPS Take by mouth daily.   Psyllium (METAMUCIL PO) Take 19 g by mouth daily. As directed   Red Yeast Rice 600 MG TABS Take 2 tablets by mouth daily.   tadalafil  (CIALIS ) 20 MG tablet TAKE ONE TABLET BY MOUTH DAILY AS NEEDED for erectile dysfunction   [DISCONTINUED] albuterol  (VENTOLIN  HFA) 108 (90 Base) MCG/ACT inhaler Inhale 2 puffs into the lungs every 6 (six) hours as needed for wheezing or  shortness of breath.   [DISCONTINUED] amoxicillin -clavulanate (AUGMENTIN ) 875-125 MG tablet Take 1 tablet by mouth 2 (two) times daily.   [DISCONTINUED] diclofenac  (VOLTAREN ) 75 MG EC tablet Take 1 tablet (75 mg total) by mouth 2 (two) times daily.   [DISCONTINUED] guaiFENesin (MUCINEX) 600 MG 12 hr tablet Take by mouth 2 (two) times daily.   [DISCONTINUED] Phenylephrine-DM (THERAFLU COLD/COUGH DAYTIME PO) Take 20 mLs by mouth as needed.     Allergies:   Patient has no known allergies.   Past Medical History:  Diagnosis Date   Cataract    had Bil   Cough    on and off   Dyslipidemia    History of colon polyps    HTN (hypertension)    Hyperlipidemia    Scoliosis     Past Surgical History:  Procedure Laterality Date   COLONOSCOPY  2010   peters   HERNIA REPAIR     right inguinal hernia   HIP SURGERY     right inguinal hernia     scoliosis     x2  TONSILLECTOMY       Social History:  The patient  reports that he has never smoked. He has never used smokeless tobacco. He reports that he does not drink alcohol and does not use drugs.   Family History:  [name] family history includes COPD in his brother; Cancer in his brother, father, and sister; Coronary artery disease in his brother; Esophageal cancer in his brother; Heart attack in his mother; Heart disease in his brother and mother; Heart failure in his mother; Lung cancer in his sister.    ROS:  Please see the history of present illness. All other systems are reviewed and  Negative to the above problem except as noted.    PHYSICAL EXAM: VS:  BP 116/70   Pulse (!) 59   Ht 5\' 10"  (1.778 m)   Wt 163 lb (73.9 kg)   SpO2 95%   BMI 23.39 kg/m     GEN: Well nourished, well developed, in no acute distress  HEENT: normal  Neck: no JVD, carotid bruits, Cardiac: RRR; no murmur  No LE  edema  Respiratory:  clear to auscultation bilaterally GI: soft, nontender, no masses   No hepatomegaly  MS: no deformity Moving  all extremities   Skin: warm and dry, no rash  EKG:  EKG is not done today   Coronary CT angiogram   2022 Aorta: Normal size. Ascending aorta 3.0 cm. No calcifications. No dissection. Aortic Valve:  Trileaflet.  No calcifications. Coronary Arteries:  Normal coronary origin.  Right dominance. RCA is a large dominant artery that gives rise to PDA and PLVB. There is no plaque. Left main is a large artery that gives rise to LAD, LCX, and RI arteries. LAD is a large vessel that has minimal (<25%) calcified plaque. There are two tiny diagonal vessels without plaque. LCX is a non-dominant artery that gives rise to one large OM1 branch. There is minimal (<25%) calcified plaque in the LCX proximally. There is a tiny OM1 and large OM2 without plaque.   Coronary Calcium Score:   Left main: 0 Left anterior descending artery: 37.2 Left circumflex artery: 61.9 Right coronary artery: 0   Total: 99.1   IMPRESSION: 1. Coronary calcium score of 99.1 This was 35th percentile for age-, race-, and sex-matched controls.   Lipid Panel    Component Value Date/Time   CHOL 183 11/09/2023 1525   TRIG 122 11/09/2023 1525   HDL 53 11/09/2023 1525   CHOLHDL 3.5 11/09/2023 1525   CHOLHDL 3.3 12/16/2016 0854   VLDL 15 12/16/2016 0854   LDLCALC 108 (H) 11/09/2023 1525      Wt Readings from Last 3 Encounters:  11/30/23 163 lb (73.9 kg)  07/16/23 172 lb 3.2 oz (78.1 kg)  04/23/23 170 lb 3.2 oz (77.2 kg)      ASSESSMENT AND PLAN:  1  CAD  Minimal CAD on CT scan  Pt denies CP  2  Hx HTN   BP is very well ocntrolled  At home  94/ for one reading  Otherwise 110s to 120s/ REcomm:  Keep on same meds   If BP in 90s/ hold amlodipine       3   HL  Pt on red yeast rice   DOes not want to consider other meds    He has had a marked change in diet    Even though he had lipids just done I would recomm a lipomed panel to assess particle type and number  4  Metabolics  Congratulated him on the  changes made  DOing better   WIll check A1C    Follow up in Jan 2026    Current medicines are reviewed at length with the patient today.  The patient does not have concerns regarding medicines.  Signed, Ola Berger, MD  11/30/2023 9:41 AM    [facility] [address] [place], [place], [place]  [address] Phone: [phone]; Fax: [phone]

## 2023-11-30 ENCOUNTER — Encounter: Payer: Self-pay | Admitting: Internal Medicine

## 2023-11-30 ENCOUNTER — Ambulatory Visit: Payer: PPO | Attending: Internal Medicine | Admitting: Internal Medicine

## 2023-11-30 VITALS — BP 116/70 | HR 59 | Ht 70.0 in | Wt 163.0 lb

## 2023-11-30 DIAGNOSIS — I2510 Atherosclerotic heart disease of native coronary artery without angina pectoris: Secondary | ICD-10-CM

## 2023-11-30 DIAGNOSIS — E785 Hyperlipidemia, unspecified: Secondary | ICD-10-CM | POA: Diagnosis not present

## 2023-11-30 NOTE — Patient Instructions (Addendum)
 Medication Instructions:   IF BP RUNNING LOW... BELOW 100/60 THEN DO NOT TAKE YOUR AMLODIPINE  THAT DAY   *If you need a refill on your cardiac medications before your next appointment, please call your pharmacy*  Lab Work: NMR. APO B, LIPO A, HGBA1C TODAY  If you have labs (blood work) drawn today and your tests are completely normal, you will receive your results only by: MyChart Message (if you have MyChart) OR A paper copy in the mail If you have any lab test that is abnormal or we need to change your treatment, we will call you to review the results.  Testing/Procedures:   Follow-Up: JANUARY 2026  At [facility], you and your health needs are our priority.  As part of our continuing mission to provide you with exceptional heart care, our providers are all part of one team.  This team includes your primary Cardiologist (physician) and Advanced Practice Providers or APPs (Physician Assistants and Nurse Practitioners) who all work together to provide you with the care you need, when you need it.  Your next appointment We recommend signing up for the patient portal called "MyChart".  Sign up information is provided on this After Visit Summary.  MyChart is used to connect with patients for Virtual Visits (Telemedicine).  Patients are able to view lab/test results, encounter notes, upcoming appointments, etc.  Non-urgent messages can be sent to your provider as well.   To learn more about what you can do with MyChart, go to [email or web address].   Other Instructions

## 2023-12-01 ENCOUNTER — Ambulatory Visit: Payer: Self-pay | Admitting: *Deleted

## 2023-12-01 LAB — NMR, LIPOPROFILE
Cholesterol, Total: 189 mg/dL (ref 100–199)
HDL Particle Number: 32.2 umol/L (ref >=30.5)
HDL-C: 59 mg/dL (ref >39)
LDL Particle Number: 1271 nmol/L — ABNORMAL HIGH (ref <1000)
LDL Size: 21 nm (ref >20.5)
LDL-C (NIH Calc): 117 mg/dL — ABNORMAL HIGH (ref 0–99)
LP-IR Score: 35 (ref <=45)
Small LDL Particle Number: 388 nmol/L (ref <=527)
Triglycerides: 72 mg/dL (ref 0–149)

## 2023-12-01 LAB — LIPOPROTEIN A (LPA): Lipoprotein (a): 17.8 nmol/L (ref <75.0)

## 2023-12-01 LAB — HEMOGLOBIN A1C
Est. average glucose Bld gHb Est-mCnc: 114 mg/dL
Hgb A1c MFr Bld: 5.6 % (ref 4.8–5.6)

## 2023-12-01 LAB — APOLIPOPROTEIN B: Apolipoprotein B: 90 mg/dL — ABNORMAL HIGH (ref <90)

## 2024-01-02 ENCOUNTER — Other Ambulatory Visit: Payer: Self-pay | Admitting: Family Medicine

## 2024-01-02 DIAGNOSIS — I10 Essential (primary) hypertension: Secondary | ICD-10-CM

## 2024-01-26 ENCOUNTER — Ambulatory Visit: Payer: PPO

## 2024-01-26 DIAGNOSIS — Z0000 Encounter for general adult medical examination without abnormal findings: Secondary | ICD-10-CM

## 2024-01-26 NOTE — Progress Notes (Signed)
 Subjective:   Samuel MIKLES Sr. is a 78 y.o. who presents for a Medicare Wellness preventive visit.  As a reminder, Annual Wellness Visits don't include a physical exam, and some assessments may be limited, especially if this visit is performed virtually. We may recommend an in-person follow-up visit with your provider if needed.  Visit Complete: Virtual I connected with  Samuel KNIERIM Sr. on 01/26/24 by a audio enabled telemedicine application and verified that I am speaking with the correct person using two identifiers.  Patient Location: Home  Provider Location: Office/Clinic  I discussed the limitations of evaluation and management by telemedicine. The patient expressed understanding and agreed to proceed.  Vital Signs: Because this visit was a virtual/telehealth visit, some criteria may be missing or patient reported. Any vitals not documented were not able to be obtained and vitals that have been documented are patient reported.  VideoError- Librarian, academic were attempted between this provider and patient, however failed, due to patient having technical difficulties OR patient did not have access to video capability.  We continued and completed visit with audio only.   Persons Participating in Visit: Patient.  AWV Questionnaire: No: Patient Medicare AWV questionnaire was not completed prior to this visit.  Cardiac Risk Factors include: advanced age (>41men, >26 women);dyslipidemia;hypertension;male gender     Objective:    Today's Vitals   There is no height or weight on file to calculate BMI.     01/26/2024   11:34 AM 04/16/2022    9:27 AM 04/11/2021    9:24 AM 04/10/2020   10:24 AM 02/04/2019    9:50 AM 12/20/2018    8:30 AM 12/17/2017    8:53 AM  Advanced Directives  Does Patient Have a Medical Advance Directive? No No No No No No No   Would patient like information on creating a medical advance directive? No - Patient declined Yes (ED -  Information included in AVS) Yes (ED - Information included in AVS) Yes (ED - Information included in AVS)  Yes (MAU/Ambulatory/Procedural Areas - Information given)  Yes (MAU/Ambulatory/Procedural Areas - Information given)      Data saved with a previous flowsheet row definition    Current Medications (verified) Outpatient Encounter Medications as of 01/26/2024  Medication Sig   Acetaminophen (TYLENOL 8 HOUR PO) Take by mouth.   amLODipine  (NORVASC ) 2.5 MG tablet TAKE 1 TABLET(2.5 MG) BY MOUTH DAILY   Ascorbic Acid (VITAMIN C) 500 MG tablet Take 1,000 mg by mouth daily.   aspirin 81 MG tablet Take 81 mg by mouth every other day.   co-enzyme Q-10 30 MG capsule Take 100 mg by mouth daily.   lisinopril -hydrochlorothiazide  (ZESTORETIC ) 20-12.5 MG tablet TAKE 1 TABLET BY MOUTH DAILY   Multiple Vitamin (MULTIVITAMIN) capsule Take 1 capsule by mouth daily.   Omega-3 Fatty Acids (FISH OIL PO) Take 2 tablets by mouth daily.   PROBIOTIC CAPS Take by mouth daily.   Psyllium (METAMUCIL PO) Take 19 g by mouth daily. As directed   Red Yeast Rice 600 MG TABS Take 2 tablets by mouth daily.   tadalafil  (CIALIS ) 20 MG tablet TAKE ONE TABLET BY MOUTH DAILY AS NEEDED for erectile dysfunction   Facility-Administered Encounter Medications as of 01/26/2024  Medication   influenza  inactive virus vaccine (FLUZONE/FLUARIX) injection 0.5 mL    Allergies (verified) Patient has no known allergies.   History: Past Medical History:  Diagnosis Date   Cataract    had Bil  Cough    on and off   Dyslipidemia    History of colon polyps    HTN (hypertension)    Hyperlipidemia    Scoliosis    Past Surgical History:  Procedure Laterality Date   COLONOSCOPY  2010   peters   HERNIA REPAIR     right inguinal hernia   HIP SURGERY     right inguinal hernia     scoliosis     x2   TONSILLECTOMY     Family History  Problem Relation Age of Onset   Heart failure Mother    Heart attack Mother    Heart  disease Mother    Cancer Father    Cancer Sister    Lung cancer Sister    Cancer Brother    Esophageal cancer Brother    Coronary artery disease Brother    Heart disease Brother    COPD Brother    Social History   Socioeconomic History   Marital status: Married    Spouse name: Not on file   Number of children: Not on file   Years of education: Not on file   Highest education level: Not on file  Occupational History   Not on file  Tobacco Use   Smoking status: Never   Smokeless tobacco: Never  Vaping Use   Vaping status: Never Used  Substance and Sexual Activity   Alcohol use: No   Drug use: No   Sexual activity: Yes  Other Topics Concern   Not on file  Social History Narrative   Not on file   Social Drivers of Health   Financial Resource Strain: Low Risk  (01/26/2024)   Overall Financial Resource Strain (CARDIA)    Difficulty of Paying Living Expenses: Not hard at all  Food Insecurity: No Food Insecurity (01/26/2024)   Hunger Vital Sign    Worried About Running Out of Food in the Last Year: Never true    Ran Out of Food in the Last Year: Never true  Transportation Needs: No Transportation Needs (01/26/2024)   PRAPARE - Administrator, Civil Service (Medical): No    Lack of Transportation (Non-Medical): No  Physical Activity: Sufficiently Active (01/26/2024)   Exercise Vital Sign    Days of Exercise per Week: 5 days    Minutes of Exercise per Session: 30 min  Stress: No Stress Concern Present (01/26/2024)   [employer] of Occupational Health - Occupational Stress Questionnaire    Feeling of Stress: Not at all  Social Connections: Patient Declined (01/26/2024)   Social Connection and Isolation Panel    Frequency of Communication with Friends and Family: Patient declined    Frequency of Social Gatherings with Friends and Family: Patient declined    Attends Religious Services: Patient declined    Database administrator or Organizations: Patient  declined    Attends Banker Meetings: Patient declined    Marital Status: Patient declined    Tobacco Counseling Counseling given: Not Answered    Clinical Intake:  Pre-visit preparation completed: Yes  Pain : No/denies pain     Nutritional Risks: None Diabetes: No  Lab Results  Component Value Date   HGBA1C 5.6 11/30/2023   HGBA1C 5.7 (H) 04/16/2022   HGBA1C 5.9 (H) 04/11/2021     How often do you need to have someone help you when you read instructions, pamphlets, or other written materials from your doctor or pharmacy?: 1 - Never  Interpreter Needed?: No  Information entered by :: NAllen LPN   Activities of Daily Living     01/26/2024   11:28 AM  In your present state of health, do you have any difficulty performing the following activities:  Hearing? 1  Comment has hearing aids but does not wear  Vision? 0  Difficulty concentrating or making decisions? 0  Walking or climbing stairs? 0  Dressing or bathing? 0  Doing errands, shopping? 0  Preparing Food and eating ? N  Using the Toilet? N  In the past six months, have you accidently leaked urine? N  Do you have problems with loss of bowel control? N  Managing your Medications? N  Managing your Finances? N  Housekeeping or managing your Housekeeping? N    Patient Care Team: Joyce Norleen BROCKS, MD as PCP - General (Family Medicine) Okey Vina GAILS, MD as PCP - Cardiology (Cardiology)  I have updated your Care Teams any recent Medical Services you may have received from other providers in the past year.     Assessment:   This is a routine wellness examination for Hewitt.  Hearing/Vision screen Hearing Screening - Comments:: Has hearing aids that he does not wear Vision Screening - Comments:: Regular eye exams, [facility]   Goals Addressed             This Visit's Progress    Patient Stated       01/26/2024, eating healthy       Depression Screen     01/26/2024   11:36 AM  01/20/2023   11:35 AM 04/16/2022    9:28 AM 04/11/2021    9:25 AM 04/10/2020   10:27 AM 12/20/2018    8:08 AM 12/17/2017    8:27 AM  PHQ 2/9 Scores  PHQ - 2 Score 0 0 0 0 0 0 0  PHQ- 9 Score 0 0         Fall Risk     01/26/2024   11:35 AM 01/20/2023   11:34 AM 04/16/2022    9:28 AM 04/11/2021    9:24 AM 04/10/2020   10:27 AM  Fall Risk   Falls in the past year? 0 0 0 0 0  Number falls in past yr: 0 0 0 0   Injury with Fall? 0 0 0 0   Risk for fall due to : Medication side effect Medication side effect No Fall Risks No Fall Risks   Follow up Falls evaluation completed;Falls prevention discussed Falls prevention discussed;Falls evaluation completed Falls evaluation completed  Falls evaluation completed       Data saved with a previous flowsheet row definition    MEDICARE RISK AT HOME:  Medicare Risk at Home Any stairs in or around the home?: No (has a ramp) If so, are there any without handrails?: No Home free of loose throw rugs in walkways, pet beds, electrical cords, etc?: Yes Adequate lighting in your home to reduce risk of falls?: Yes Life alert?: No Use of a cane, walker or w/c?: No Grab bars in the bathroom?: Yes Shower chair or bench in shower?: Yes Elevated toilet seat or a handicapped toilet?: No  TIMED UP AND GO:  Was the test performed?  No  Cognitive Function: 6CIT completed        01/26/2024   11:37 AM 01/20/2023   11:36 AM 04/16/2022    9:31 AM  6CIT Screen  What Year? 0 points 0 points 0 points  What month? 0 points 0 points 0 points  What time? 0 points 0 points 0 points  Count back from 20 0 points 0 points 0 points  Months in reverse 0 points 0 points 0 points  Repeat phrase 0 points 0 points 10 points  Total Score 0 points 0 points 10 points    Immunizations Immunization History  Administered Date(s) Administered   Fluad Quad(high Dose 65+) 05/02/2019, 04/10/2020, 04/11/2021, 05/09/2022   Fluad Trivalent(High Dose 65+) 04/23/2023   Influenza Split  05/20/2011, 04/23/2012   Influenza, High Dose Seasonal PF 04/21/2013, 05/11/2014, 03/28/2015, 05/01/2016, 05/05/2017, 04/23/2018   PFIZER(Purple Top)SARS-COV-2 Vaccination 09/21/2019, 10/19/2019, 04/19/2020   Pfizer Covid-19 Vaccine Bivalent Booster 43yrs & up 04/11/2021   Pneumococcal Conjugate-13 01/23/2014   Pneumococcal Polysaccharide-23 07/21/2007   Td 12/12/2014   Tdap 12/15/2003   Zoster Recombinant(Shingrix) 03/07/2019, 05/09/2019   Zoster, Live 07/21/2007    Screening Tests Health Maintenance  Topic Date Due   Pneumococcal Vaccine: 50+ Years (3 of 3 - PCV20 or PCV21) 01/24/2019   COVID-19 Vaccine (5 - 2024-25 season) 03/15/2023   INFLUENZA VACCINE  02/12/2024   DTaP/Tdap/Td (3 - Td or Tdap) 12/11/2024   Medicare Annual Wellness (AWV)  01/25/2025   Hepatitis C Screening  Completed   Zoster Vaccines- Shingrix  Completed   Hepatitis B Vaccines  Aged Out   HPV VACCINES  Aged Out   Meningococcal B Vaccine  Aged Out   Colonoscopy  Discontinued    Health Maintenance  Health Maintenance Due  Topic Date Due   Pneumococcal Vaccine: 50+ Years (3 of 3 - PCV20 or PCV21) 01/24/2019   COVID-19 Vaccine (5 - 2024-25 season) 03/15/2023   Health Maintenance Items Addressed: Due for pneumonia vaccine and declines covid vaccine.  Additional Screening:  Vision Screening: Recommended annual ophthalmology exams for early detection of glaucoma and other disorders of the eye. Would you like a referral to an eye doctor? No    Dental Screening: Recommended annual dental exams for proper oral hygiene  Community Resource Referral / Chronic Care Management: CRR required this visit?  No   CCM required this visit?  No   Plan:    I have personally reviewed and noted the following in the patient's chart:   Medical and social history Use of alcohol, tobacco or illicit drugs  Current medications and supplements including opioid prescriptions. Patient is not currently taking opioid  prescriptions. Functional ability and status Nutritional status Physical activity Advanced directives List of other physicians Hospitalizations, surgeries, and ER visits in previous 12 months Vitals Screenings to include cognitive, depression, and falls Referrals and appointments  In addition, I have reviewed and discussed with patient certain preventive protocols, quality metrics, and best practice recommendations. A written personalized care plan for preventive services as well as general preventive health recommendations were provided to patient.   Ardella FORBES Dawn, LPN   2/84/7974   After Visit Summary: (MyChart) Due to this being a telephonic visit, the after visit summary with patients personalized plan was offered to patient via MyChart   Notes: Nothing significant to report at this time.

## 2024-01-26 NOTE — Patient Instructions (Signed)
 Mr. [name] , Thank you for taking time out of your busy schedule to complete your Annual Wellness Visit with me. I enjoyed our conversation and look forward to speaking with you again next year. I, as well as your care team,  appreciate your ongoing commitment to your health goals. Please review the following plan we discussed and let me know if I can assist you in the future. Your Game plan/ To Do List    Referrals: If you haven't heard from the office you've been referred to, please reach out to them at the phone provided.  N/a Follow up Visits: Next Medicare AWV with our clinical staff: 01/31/2025 at 11:30   Have you seen your provider in the last 6 months (3 months if uncontrolled diabetes)? No Next Office Visit with your provider: 06/02/2024 at 9:15  Clinician Recommendations:  Aim for 30 minutes of exercise or brisk walking, 6-8 glasses of water, and 5 servings of fruits and vegetables each day.       This is a list of the screening recommended for you and due dates:  Health Maintenance  Topic Date Due   Pneumococcal Vaccine for age over 67 (3 of 3 - PCV20 or PCV21) 01/24/2019   COVID-19 Vaccine (5 - 2024-25 season) 03/15/2023   Flu Shot  02/12/2024   DTaP/Tdap/Td vaccine (3 - Td or Tdap) 12/11/2024   Medicare Annual Wellness Visit  01/25/2025   Hepatitis C Screening  Completed   Zoster (Shingles) Vaccine  Completed   Hepatitis B Vaccine  Aged Out   HPV Vaccine  Aged Out   Meningitis B Vaccine  Aged Out   Colon Cancer Screening  Discontinued    Advanced directives: (Declined) Advance directive discussed with you today. Even though you declined this today, please call our office should you change your mind, and we can give you the proper paperwork for you to fill out. Advance Care Planning is important because it:  [x]  Makes sure you receive the medical care that is consistent with your values, goals, and preferences  [x]  It provides guidance to your family and loved ones and  reduces their decisional burden about whether or not they are making the right decisions based on your wishes.  Follow the link provided in your after visit summary or read over the paperwork we have mailed to you to help you started getting your Advance Directives in place. If you need assistance in completing these, please reach out to us  so that we can help you!  See attachments for Preventive Care and Fall Prevention Tips.

## 2024-04-29 ENCOUNTER — Other Ambulatory Visit: Payer: Self-pay | Admitting: Family Medicine

## 2024-04-29 DIAGNOSIS — I10 Essential (primary) hypertension: Secondary | ICD-10-CM

## 2024-05-09 ENCOUNTER — Ambulatory Visit (INDEPENDENT_AMBULATORY_CARE_PROVIDER_SITE_OTHER)

## 2024-05-09 DIAGNOSIS — Z23 Encounter for immunization: Secondary | ICD-10-CM

## 2024-05-24 ENCOUNTER — Telehealth: Payer: Self-pay

## 2024-05-24 NOTE — Progress Notes (Signed)
   05/24/2024  Patient ID: Samuel LELON Elbe Sr., male   DOB: 01/05/1946, 78 y.o.   MRN: [id number]  Pharmacy Quality Measure Review  This patient is appearing on a report for being at risk of failing the adherence measure for hypertension (ACEi/ARB) medications this calendar year.   Medication: Lisinopril -HCTZ Last fill date: 01/05/24 for 90 day supply  Spoke with patient. He reports he only takes both of his BP meds PRN for high blood pressure. Started doing this once he started seeing SBP numbers below 100. Checks BP daily to monitor and has no BP concerns lately. Reminded upcoming AWV visit for CPE. Will route to provider for awareness.  Jon VEAR Lindau, PharmD Clinical Pharmacist [phone]

## 2024-05-26 ENCOUNTER — Other Ambulatory Visit: Payer: Self-pay

## 2024-05-26 ENCOUNTER — Encounter: Payer: Self-pay | Admitting: Sports Medicine

## 2024-05-26 ENCOUNTER — Ambulatory Visit: Admitting: Sports Medicine

## 2024-05-26 ENCOUNTER — Ambulatory Visit: Admitting: Orthopaedic Surgery

## 2024-05-26 ENCOUNTER — Ambulatory Visit (INDEPENDENT_AMBULATORY_CARE_PROVIDER_SITE_OTHER): Payer: Self-pay

## 2024-05-26 DIAGNOSIS — G8929 Other chronic pain: Secondary | ICD-10-CM

## 2024-05-26 DIAGNOSIS — M25511 Pain in right shoulder: Secondary | ICD-10-CM

## 2024-05-26 DIAGNOSIS — M19011 Primary osteoarthritis, right shoulder: Secondary | ICD-10-CM

## 2024-05-26 MED ORDER — METHYLPREDNISOLONE ACETATE 40 MG/ML IJ SUSP
80.0000 mg | INTRAMUSCULAR | Status: AC | PRN
  Administered 2024-05-26: 80 mg via INTRA_ARTICULAR

## 2024-05-26 MED ORDER — LIDOCAINE HCL 1 % IJ SOLN
2.0000 mL | INTRAMUSCULAR | Status: AC | PRN
  Administered 2024-05-26: 2 mL

## 2024-05-26 MED ORDER — BUPIVACAINE HCL 0.25 % IJ SOLN
2.0000 mL | INTRAMUSCULAR | Status: AC | PRN
  Administered 2024-05-26: 2 mL via INTRA_ARTICULAR

## 2024-05-26 NOTE — Progress Notes (Signed)
   Procedure Note  Patient: Samuel BRIBIESCA Sr.             Date of Birth: 12/19/1945           MRN: [id number]             Visit Date: 05/26/2024  Procedures: Visit Diagnoses:  1. Primary osteoarthritis, right shoulder   2. Chronic right shoulder pain    Lab Results  Component Value Date   HGBA1C 5.6 11/30/2023   Large Joint Inj: R glenohumeral on 05/26/2024 10:04 AM Indications: pain Details: 22 G 3.5 in needle, ultrasound-guided posterior approach Medications: 2 mL lidocaine  1 %; 2 mL bupivacaine  0.25 %; 80 mg methylPREDNISolone  acetate 40 MG/ML Outcome: tolerated well, no immediate complications  US -guided glenohumeral joint injection, right shoulder After discussion on risks/benefits/indications, informed verbal consent was obtained. A timeout was then performed. The patient was positioned lying lateral recumbent on examination table. [name] shoulder was prepped with betadine and multiple alcohol swabs and utilizing ultrasound guidance, the patient's glenohumeral joint was identified on ultrasound. Using ultrasound guidance a 22-gauge, 3.5 inch needle with a mixture of 2:2:2 cc's lidocaine :bupivicaine:depomedrol was directed from a lateral to medial direction via in-plane technique into the glenohumeral joint with visualization of appropriate spread of injectate into the joint. Patient tolerated the procedure well without immediate complications.      Procedure, treatment alternatives, risks and benefits explained, specific risks discussed. Consent was given by the patient. Immediately prior to procedure a time out was called to verify the correct patient, procedure, equipment, support staff and site/side marked as required. Patient was prepped and draped in the usual sterile fashion.     - patient tolerated procedure well, discussed post-injection protocol - follow-up with Dr. Jerri as indicated for shoulder; I am happy to see them as needed  Lonell Sprang, DO Primary Care  Sports Medicine Physician  [facility] - Orthopedics  This note was dictated using Dragon naturally speaking software and may contain errors in syntax, spelling, or content which have not been identified prior to signing this note.

## 2024-05-26 NOTE — Progress Notes (Signed)
 Office Visit Note   Patient: Samuel HOAGLIN Sr.           Date of Birth: 03/01/1946           MRN: [id number] Visit Date: 05/26/2024              Requested by: Joyce Norleen BROCKS, MD [address] [place],  [place] [address] PCP: Joyce Norleen BROCKS, MD   Assessment & Plan: Visit Diagnoses:  1. Chronic right shoulder pain     Plan: History of Present Illness Samuel ANTONINI Sr. is a 78 year old male who presents with right shoulder pain and right middle finger pain.  He experiences significant right shoulder pain, limiting his ability to raise his arm overhead and perform tasks such as pulling up his pants and reaching his back belt loops. Assisting his handicapped daughter has become challenging due to this pain. He recalls a similar issue with his left shoulder that improved with a cortisone injection.  He also has ongoing pain in his right middle finger, with discomfort radiating through the finger during use. He was previously prescribed diclofenac  for this condition. He is reluctant to consider surgical intervention.  Physical Exam MUSCULOSKELETAL: Right shoulder motion causes pain.  Manual muscle testing of the rotator cuff shows adequate strength.  Assessment and Plan Right shoulder osteoarthritis with pain Chronic osteoarthritis with significant pain and functional limitation. Imaging shows near bone-on-bone arthritis. - Referred to Dr. Burnetta for ultrasound-guided cortisone injection in the right shoulder. - In regards to the finger issue he will make an appointment with Dr. Arlinda.  Follow-Up Instructions: No follow-ups on file.   Orders:  Orders Placed This Encounter  Procedures   XR Shoulder Right   AMB referral to sports medicine   No orders of the defined types were placed in this encounter.     Procedures: No procedures performed   Clinical Data: No additional findings.   Subjective: Chief Complaint  Patient presents with   Right Shoulder - Pain    Right Middle Finger - Pain    HPI  Review of Systems  Constitutional: Negative.   HENT: Negative.    Eyes: Negative.   Respiratory: Negative.    Cardiovascular: Negative.   Gastrointestinal: Negative.   Endocrine: Negative.   Genitourinary: Negative.   Skin: Negative.   Allergic/Immunologic: Negative.   Neurological: Negative.   Hematological: Negative.   Psychiatric/Behavioral: Negative.    All other systems reviewed and are negative.    Objective: Vital Signs: There were no vitals taken for this visit.  Physical Exam Vitals and nursing note reviewed.  Constitutional:      Appearance: He is well-developed.  HENT:     Head: Normocephalic and atraumatic.  Eyes:     Pupils: Pupils are equal, round, and reactive to light.  Pulmonary:     Effort: Pulmonary effort is normal.  Abdominal:     Palpations: Abdomen is soft.  Musculoskeletal:        General: Normal range of motion.     Cervical back: Neck supple.  Skin:    General: Skin is warm.  Neurological:     Mental Status: He is alert and oriented to person, place, and time.  Psychiatric:        Behavior: Behavior normal.        Thought Content: Thought content normal.        Judgment: Judgment normal.     Ortho Exam  Specialty Comments:  No specialty comments  available.  Imaging: XR Shoulder Right Result Date: 05/26/2024 X-rays of the right shoulder show moderately severe glenohumeral osteoarthritis with inferior humeral head spur and significant decrease joint space.    PMFS History: Patient Active Problem List   Diagnosis Date Noted   Erectile dysfunction due to arterial insufficiency 04/22/2023   Arthritis 04/22/2023   Impingement syndrome of left shoulder 09/19/2022   Hand strain, right, sequela 09/19/2022   Statin intolerance 04/16/2022   Glaucoma 08/20/2020   Hearing loss 03/30/2018   Idiopathic scoliosis 11/15/2012   Hyperlipidemia LDL goal <100 02/10/2009   Essential hypertension  02/10/2009   Past Medical History:  Diagnosis Date   Cataract    had Bil   Cough    on and off   Dyslipidemia    History of colon polyps    HTN (hypertension)    Hyperlipidemia    Scoliosis     Family History  Problem Relation Age of Onset   Heart failure Mother    Heart attack Mother    Heart disease Mother    Cancer Father    Cancer Sister    Lung cancer Sister    Cancer Brother    Esophageal cancer Brother    Coronary artery disease Brother    Heart disease Brother    COPD Brother     Past Surgical History:  Procedure Laterality Date   COLONOSCOPY  2010   peters   HERNIA REPAIR     right inguinal hernia   HIP SURGERY     right inguinal hernia     scoliosis     x2   TONSILLECTOMY     Social History   Occupational History   Not on file  Tobacco Use   Smoking status: Never   Smokeless tobacco: Never  Vaping Use   Vaping status: Never Used  Substance and Sexual Activity   Alcohol use: No   Drug use: No   Sexual activity: Yes

## 2024-06-02 ENCOUNTER — Encounter: Payer: Self-pay | Admitting: Family Medicine

## 2024-06-02 ENCOUNTER — Ambulatory Visit (INDEPENDENT_AMBULATORY_CARE_PROVIDER_SITE_OTHER): Payer: PPO | Admitting: Family Medicine

## 2024-06-02 VITALS — BP 136/82 | HR 59 | Ht 69.0 in | Wt 157.6 lb

## 2024-06-02 DIAGNOSIS — Z23 Encounter for immunization: Secondary | ICD-10-CM

## 2024-06-02 DIAGNOSIS — I10 Essential (primary) hypertension: Secondary | ICD-10-CM

## 2024-06-02 DIAGNOSIS — Z0000 Encounter for general adult medical examination without abnormal findings: Secondary | ICD-10-CM

## 2024-06-02 DIAGNOSIS — E785 Hyperlipidemia, unspecified: Secondary | ICD-10-CM

## 2024-06-02 DIAGNOSIS — M19041 Primary osteoarthritis, right hand: Secondary | ICD-10-CM

## 2024-06-02 DIAGNOSIS — Z125 Encounter for screening for malignant neoplasm of prostate: Secondary | ICD-10-CM

## 2024-06-02 MED ORDER — DICLOFENAC SODIUM 1 % EX GEL
2.0000 g | Freq: Four times a day (QID) | CUTANEOUS | 0 refills | Status: AC
Start: 2024-06-02 — End: ?

## 2024-06-02 NOTE — Progress Notes (Signed)
 Name: Samuel DALGLEISH Sr.   Date of Visit: 06/02/24   Date of last visit with me: Visit date not found   CHIEF COMPLAINT:  Chief Complaint  Patient presents with   Annual Exam    Cpe. Awv.        HPI:  Discussed the use of AI scribe software for clinical note transcription with the patient, who gave verbal consent to proceed.  History of Present Illness   Samuel SOEDER Sr. is a 78 year old male who presents with persistent hand pain.  He has been experiencing pain in his hand, specifically in the finger, for the past two years following an incident with a battery drill that twisted his finger. An x-ray at the time showed no fracture, and the doctor at that time told him it was probably arthritis. The pain is described as sore, particularly when squeezing objects or playing the piano, and it is not tender to touch but aches throughout the day. The pain is localized more in the knuckle than the finger itself.  He has not pursued further treatment since the initial incident, as he found the pain manageable, though it persists. He is scheduled to see a hand specialist on December 2nd. He is interested in potential treatments such as injections or topical medications to alleviate the pain.  He discusses his hypertension management, mentioning that he takes his blood pressure regularly and adjusts his medication based on readings. He experiences dizziness, particularly when bending down and standing up, which he attributes to changes in blood pressure. He currently takes his blood pressure medication daily.         OBJECTIVE:       06/02/2024    9:28 AM  Depression screen PHQ 2/9  Decreased Interest 0  Down, Depressed, Hopeless 0  PHQ - 2 Score 0     BP Readings from Last 3 Encounters:  06/02/24 136/82  11/30/23 116/70  07/16/23 138/76    BP 136/82   Pulse (!) 59   Ht 5' 9 (1.753 m)   Wt 157 lb 9.6 oz (71.5 kg)   SpO2 97%   BMI 23.27 kg/m    Physical Exam    VITALS: BP- 136/82 MUSCULOSKELETAL: Swelling in the right hand knuckle. No tenderness in the right hand knuckle.      Physical Exam Constitutional:      Appearance: Normal appearance.  Musculoskeletal:     Comments: TTP over the MCP 3rd digit. ROM full.     Neurological:     General: No focal deficit present.     Mental Status: He is alert and oriented to person, place, and time. Mental status is at baseline.     ASSESSMENT/PLAN:   Assessment & Plan Routine general medical examination at a health care facility  Essential hypertension  Hyperlipidemia LDL goal <100  Screening PSA (prostate specific antigen)  Arthritis of right hand    Assessment and Plan    Adult Wellness Visit Routine wellness visit focused on health maintenance and chronic condition management. - Performed routine labs. - Pneumovax today. -Comprehensive annual physical exam completed today. Reviewed interval history, current medical issues, medications, allergies, and preventive care needs. Addressed all patient questions and concerns. Discussed lifestyle factors including diet, exercise, sleep, and stress management. Reviewed recommended age-appropriate screenings, labs, and vaccinations. Counseling provided on healthy habits and routine health maintenance. Follow-up as indicated based on findings and results.   Essential hypertension Blood pressure elevated at 136/82 mmHg. Previous  advice to hold medication not recommended due to overall high readings. Dizziness attributed to orthostatic changes. - Continue current antihypertensive medications. - Monitor blood pressure regularly. - Advised to drink water if experiencing dizziness after exercise. - Scheduled follow-up in one week to reassess blood pressure management.  Primary osteoarthritis, right hand Chronic pain and swelling in right hand knuckle due to arthritis. Conservative management preferred before steroid injection. - Apply topical  arthritis medication to the affected area three times daily. - Scheduled follow-up in one week to assess response to topical treatment. - Consider steroid injection if no improvement with topical treatment. - Maintain appointment with hand surgeon on December 2nd if symptoms persist.         Oma Marzan A. Vita MD [facility]

## 2024-06-03 ENCOUNTER — Ambulatory Visit: Payer: Self-pay | Admitting: Family Medicine

## 2024-06-03 LAB — CBC WITH DIFFERENTIAL/PLATELET
Basophils Absolute: 0 x10E3/uL (ref 0.0–0.2)
Basos: 0 %
EOS (ABSOLUTE): 0.1 x10E3/uL (ref 0.0–0.4)
Eos: 2 %
Hematocrit: 43 % (ref 37.5–51.0)
Hemoglobin: 14.4 g/dL (ref 13.0–17.7)
Immature Grans (Abs): 0 x10E3/uL (ref 0.0–0.1)
Immature Granulocytes: 0 %
Lymphocytes Absolute: 1.5 x10E3/uL (ref 0.7–3.1)
Lymphs: 19 %
MCH: 30.7 pg (ref 26.6–33.0)
MCHC: 33.5 g/dL (ref 31.5–35.7)
MCV: 92 fL (ref 79–97)
Monocytes Absolute: 0.7 x10E3/uL (ref 0.1–0.9)
Monocytes: 9 %
Neutrophils Absolute: 5.5 x10E3/uL (ref 1.4–7.0)
Neutrophils: 70 %
Platelets: 266 x10E3/uL (ref 150–450)
RBC: 4.69 x10E6/uL (ref 4.14–5.80)
RDW: 12 % (ref 11.6–15.4)
WBC: 7.8 x10E3/uL (ref 3.4–10.8)

## 2024-06-03 LAB — LIPID PANEL
Chol/HDL Ratio: 2.5 ratio (ref 0.0–5.0)
Cholesterol, Total: 154 mg/dL (ref 100–199)
HDL: 61 mg/dL (ref >39)
LDL Chol Calc (NIH): 79 mg/dL (ref 0–99)
Triglycerides: 72 mg/dL (ref 0–149)
VLDL Cholesterol Cal: 14 mg/dL (ref 5–40)

## 2024-06-03 LAB — COMPREHENSIVE METABOLIC PANEL WITH GFR
ALT: 24 IU/L (ref 0–44)
AST: 20 IU/L (ref 0–40)
Albumin: 4.1 g/dL (ref 3.8–4.8)
Alkaline Phosphatase: 66 IU/L (ref 47–123)
BUN/Creatinine Ratio: 21 (ref 10–24)
BUN: 14 mg/dL (ref 8–27)
Bilirubin Total: 1.1 mg/dL (ref 0.0–1.2)
CO2: 24 mmol/L (ref 20–29)
Calcium: 9.5 mg/dL (ref 8.6–10.2)
Chloride: 98 mmol/L (ref 96–106)
Creatinine, Ser: 0.66 mg/dL — ABNORMAL LOW (ref 0.76–1.27)
Globulin, Total: 2.8 g/dL (ref 1.5–4.5)
Glucose: 104 mg/dL — ABNORMAL HIGH (ref 70–99)
Potassium: 4.6 mmol/L (ref 3.5–5.2)
Sodium: 138 mmol/L (ref 134–144)
Total Protein: 6.9 g/dL (ref 6.0–8.5)
eGFR: 97 mL/min/1.73 (ref >59)

## 2024-06-03 LAB — PSA: Prostate Specific Ag, Serum: 0.6 ng/mL (ref 0.0–4.0)

## 2024-06-07 ENCOUNTER — Encounter: Payer: Self-pay | Admitting: Internal Medicine

## 2024-06-08 ENCOUNTER — Ambulatory Visit: Admitting: Family Medicine

## 2024-06-08 ENCOUNTER — Encounter: Payer: Self-pay | Admitting: Family Medicine

## 2024-06-08 VITALS — BP 126/80 | HR 68 | Wt 156.6 lb

## 2024-06-08 DIAGNOSIS — M79644 Pain in right finger(s): Secondary | ICD-10-CM

## 2024-06-08 NOTE — Progress Notes (Signed)
 Name: Samuel MARKWOOD Sr.   Date of Visit: 06/08/24   Date of last visit with me: 06/02/2024   CHIEF COMPLAINT:  Chief Complaint  Patient presents with   Follow-up    6 day follow up.        HPI:  Discussed the use of AI scribe software for clinical note transcription with the patient, who gave verbal consent to proceed.  History of Present Illness   Samuel MAFFEO Sr. is a 78 year old male who presents with persistent pain in his middle finger.  He has been experiencing persistent pain in his middle finger for approximately two years. The pain is intermittent, described as 'coming and going', and is exacerbated by certain movements such as placing his hand down and moving it, which causes the pain to radiate up the finger. He is uncertain whether the pain originates from the knuckle or another part of the finger.  Approximately two years ago, he sustained an injury while using a drill, involving a twisting motion that he thought might have broken the finger. He did not seek immediate medical attention at the time of the injury. An x-ray was performed about a year ago, which he believes was ordered by Dr. Barbarann.  He has been using a topical cream for pain relief, applying it three to four times a day as needed. He notes that some days are better than others, and he adjusts the frequency of application accordingly. He has not noticed significant improvement with the cream but continues to use it to manage symptoms.         OBJECTIVE:       06/02/2024    9:28 AM  Depression screen PHQ 2/9  Decreased Interest 0  Down, Depressed, Hopeless 0  PHQ - 2 Score 0     BP Readings from Last 3 Encounters:  06/08/24 126/80  06/02/24 136/82  11/30/23 116/70    BP 126/80   Pulse 68   Wt 156 lb 9.6 oz (71 kg)   SpO2 98%   BMI 23.13 kg/m    Physical Exam          Physical Exam Constitutional:      Appearance: Normal appearance.  Neurological:     General: No focal  deficit present.     Mental Status: He is alert and oriented to person, place, and time.     Cranial Nerves: Cranial nerve deficit present.     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 Previous x-rays indicated arthritis and possible calcification or old injury. Current topical medication provides partial relief. - Previous xray's from 09/2022 reviewed.  - Ordered x-rays of right middle finger to assess status and guide treatment. - Continue topical medication up to four times daily as needed. - Advised icing finger to reduce inflammation, avoid heat. - Plan for potential steroid injection if pain worsens or persists. - Cancelled hand surgeon appointment as current management is sufficient.       Total time spent on the date of the encounter was 32 minutes, which included reviewing the patient's chart, performing a history and physical exam, ordering and reviewing studies, coordinating care, and counseling the patient regarding diagnosis and treatment options. The time spent was medically necessary and supports billing based on total time.  Treasa Bradshaw A. Vita MD [facility]

## 2024-06-08 NOTE — Patient Instructions (Signed)
 Please go get your xrays done at [facility]. You do not need to make an appointment. You can just show up.   Address: [address] [place], [place], [place] [address]

## 2024-06-14 ENCOUNTER — Encounter: Admitting: Orthopedic Surgery

## 2025-01-31 ENCOUNTER — Ambulatory Visit: Payer: Self-pay

## 2025-06-05 ENCOUNTER — Encounter: Admitting: Family Medicine
